# Patient Record
Sex: Female | Born: 1960 | Race: White | Hispanic: No | Marital: Married | Smoking: Former smoker
Health system: Southern US, Community
[De-identification: ages and names within clinical notes are randomized; demographics above are authoritative.]

## PROBLEM LIST (undated history)

## (undated) ENCOUNTER — Emergency Department (HOSPITAL_COMMUNITY): Admission: EM | Payer: Self-pay | Source: Home / Self Care

## (undated) DIAGNOSIS — I10 Essential (primary) hypertension: Secondary | ICD-10-CM

## (undated) DIAGNOSIS — F329 Major depressive disorder, single episode, unspecified: Secondary | ICD-10-CM

## (undated) DIAGNOSIS — G8929 Other chronic pain: Secondary | ICD-10-CM

## (undated) DIAGNOSIS — F32A Depression, unspecified: Secondary | ICD-10-CM

## (undated) DIAGNOSIS — M75100 Unspecified rotator cuff tear or rupture of unspecified shoulder, not specified as traumatic: Secondary | ICD-10-CM

## (undated) DIAGNOSIS — M549 Dorsalgia, unspecified: Secondary | ICD-10-CM

## (undated) DIAGNOSIS — Z9884 Bariatric surgery status: Secondary | ICD-10-CM

## (undated) DIAGNOSIS — C801 Malignant (primary) neoplasm, unspecified: Secondary | ICD-10-CM

## (undated) DIAGNOSIS — F419 Anxiety disorder, unspecified: Secondary | ICD-10-CM

## (undated) DIAGNOSIS — K760 Fatty (change of) liver, not elsewhere classified: Secondary | ICD-10-CM

## (undated) HISTORY — DX: Essential (primary) hypertension: I10

## (undated) HISTORY — DX: Unspecified rotator cuff tear or rupture of unspecified shoulder, not specified as traumatic: M75.100

## (undated) HISTORY — PX: CHOLECYSTECTOMY: SHX55

## (undated) HISTORY — DX: Major depressive disorder, single episode, unspecified: F32.9

## (undated) HISTORY — DX: Depression, unspecified: F32.A

## (undated) HISTORY — DX: Dorsalgia, unspecified: M54.9

## (undated) HISTORY — DX: Other chronic pain: G89.29

---
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
## 1996-05-17 HISTORY — PX: ABDOMINAL HYSTERECTOMY: SHX81

## 2009-07-28 ENCOUNTER — Observation Stay (HOSPITAL_COMMUNITY): Admission: EM | Admit: 2009-07-28 | Discharge: 2009-07-30 | Payer: Self-pay | Admitting: Emergency Medicine

## 2009-07-29 ENCOUNTER — Encounter (INDEPENDENT_AMBULATORY_CARE_PROVIDER_SITE_OTHER): Payer: Self-pay | Admitting: Internal Medicine

## 2010-08-10 LAB — COMPREHENSIVE METABOLIC PANEL
ALT: 69 U/L — ABNORMAL HIGH (ref 0–35)
AST: 29 U/L (ref 0–37)
Alkaline Phosphatase: 50 U/L (ref 39–117)
BUN: 18 mg/dL (ref 6–23)
CO2: 24 mEq/L (ref 19–32)
CO2: 27 mEq/L (ref 19–32)
Chloride: 107 mEq/L (ref 96–112)
Chloride: 108 mEq/L (ref 96–112)
Creatinine, Ser: 0.63 mg/dL (ref 0.4–1.2)
Creatinine, Ser: 0.69 mg/dL (ref 0.4–1.2)
GFR calc Af Amer: >60 mL/min (ref >60)
GFR calc non Af Amer: >60 mL/min (ref >60)
GFR calc non Af Amer: >60 mL/min (ref >60)
Potassium: 3.5 mEq/L (ref 3.5–5.1)
Sodium: 140 mEq/L (ref 135–145)
Total Bilirubin: 0.4 mg/dL (ref 0.3–1.2)
Total Bilirubin: 0.6 mg/dL (ref 0.3–1.2)

## 2010-08-10 LAB — CBC
HCT: 37 % (ref 36.0–46.0)
HCT: 38.2 % (ref 36.0–46.0)
Hemoglobin: 12.9 g/dL (ref 12.0–15.0)
Hemoglobin: 13.1 g/dL (ref 12.0–15.0)
MCHC: 34.3 g/dL (ref 30.0–36.0)
MCV: 89.9 fL (ref 78.0–100.0)
MCV: 90.9 fL (ref 78.0–100.0)
RBC: 4.12 MIL/uL (ref 3.87–5.11)
RBC: 4.2 MIL/uL (ref 3.87–5.11)
WBC: 7.1 10*3/uL (ref 4.0–10.5)

## 2010-08-10 LAB — DIFFERENTIAL
Basophils Absolute: 0 10*3/uL (ref 0.0–0.1)
Basophils Relative: 0 % (ref 0–1)
Eosinophils Relative: 1 % (ref 0–5)
Lymphocytes Relative: 28 % (ref 12–46)
Neutro Abs: 4.5 10*3/uL (ref 1.7–7.7)

## 2010-08-10 LAB — CK TOTAL AND CKMB (NOT AT ARMC)
CK, MB: 0.8 ng/mL (ref 0.3–4.0)
Relative Index: INVALID (ref 0.0–2.5)
Total CK: 71 U/L (ref 7–177)

## 2010-08-10 LAB — LIPID PANEL
HDL: 36 mg/dL — ABNORMAL LOW (ref >39)
Triglycerides: 101 mg/dL (ref <150)

## 2010-08-10 LAB — BASIC METABOLIC PANEL
CO2: 26 mEq/L (ref 19–32)
Calcium: 8.8 mg/dL (ref 8.4–10.5)
Chloride: 109 mEq/L (ref 96–112)
GFR calc Af Amer: >60 mL/min (ref >60)
Potassium: 4 mEq/L (ref 3.5–5.1)
Sodium: 139 mEq/L (ref 135–145)

## 2010-08-10 LAB — POCT CARDIAC MARKERS
CKMB, poc: <1 ng/mL — ABNORMAL LOW (ref 1.0–8.0)
Troponin i, poc: <0.05 ng/mL (ref 0.00–0.09)
Troponin i, poc: <0.05 ng/mL (ref 0.00–0.09)

## 2010-08-10 LAB — CARDIAC PANEL(CRET KIN+CKTOT+MB+TROPI): Troponin I: 0.03 ng/mL (ref 0.00–0.06)

## 2011-04-22 IMAGING — CR DG CHEST 2V
2 series · 2 of 2 positions shown · non-contrast
Comparison: None

CLINICAL DATA: Chest pain

CHEST - 2 VIEW

[w chest pa]
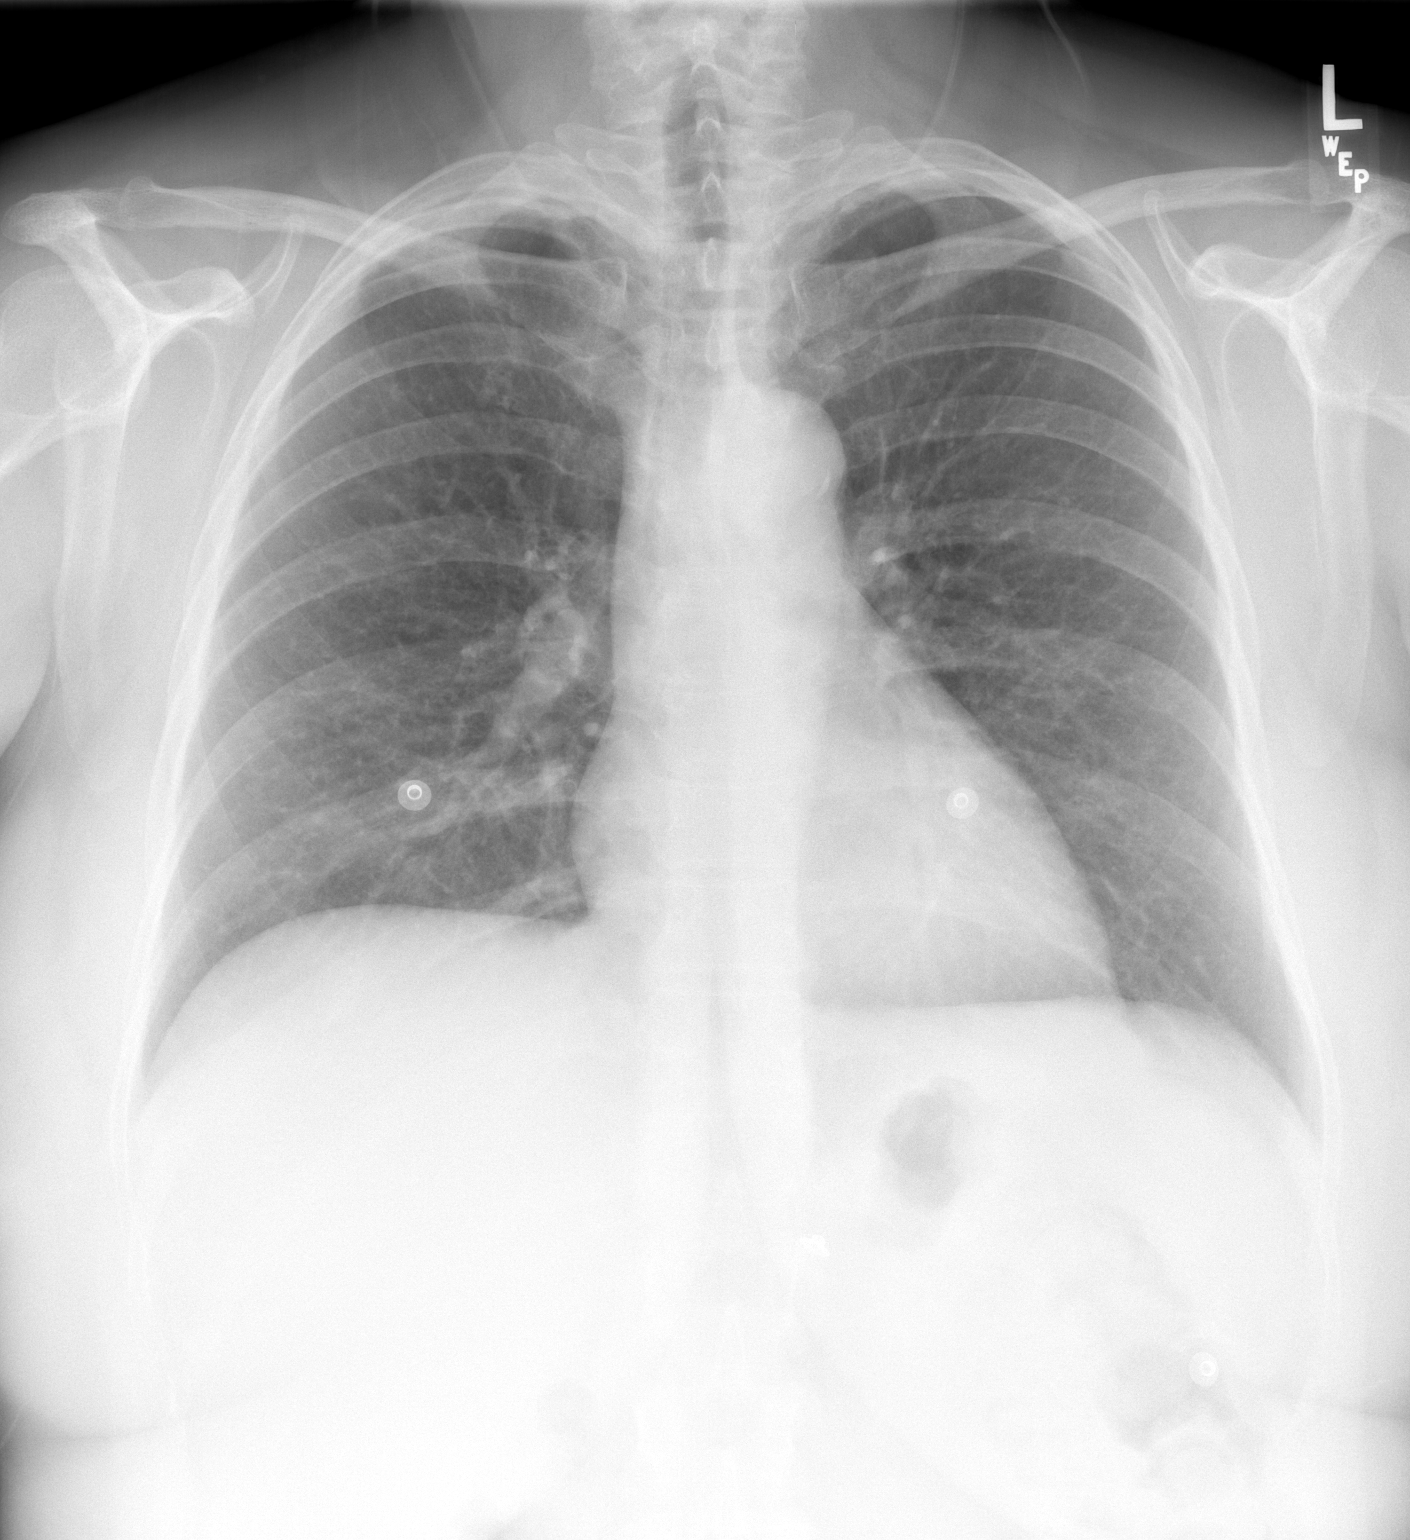

[w chest lat]
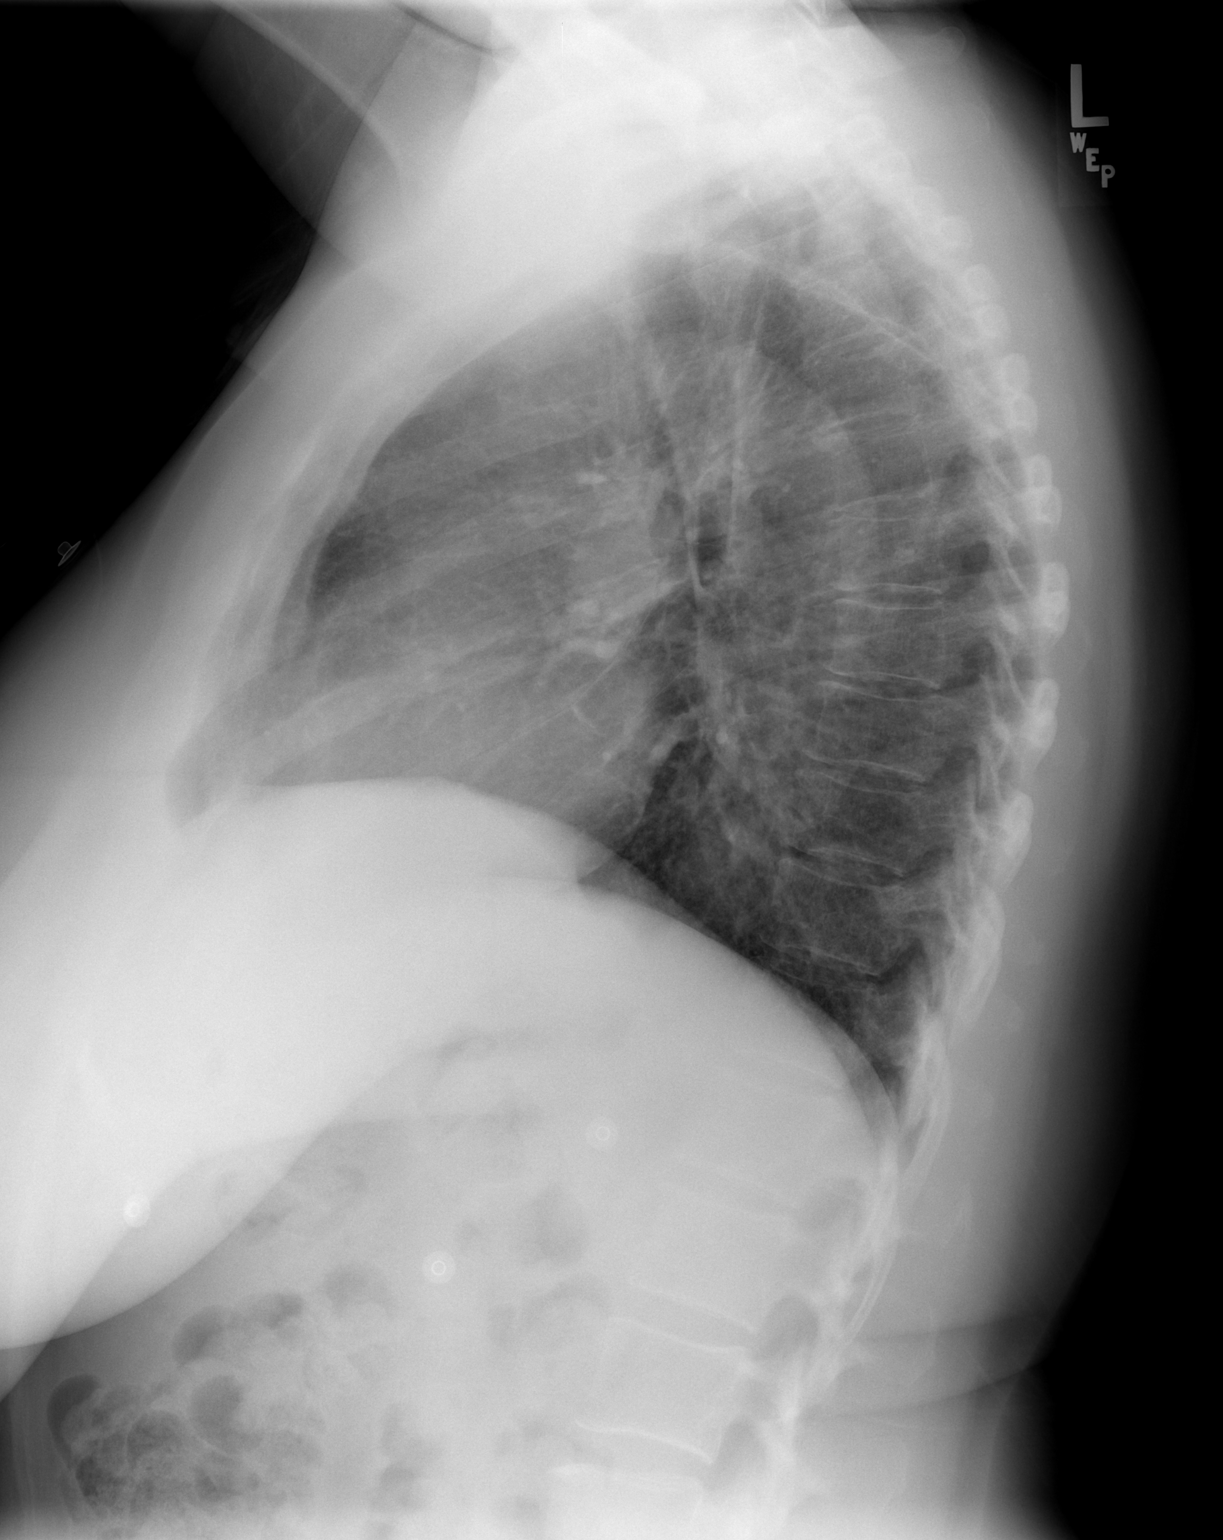

[2 of 2 positions shown; findings below may reference images not displayed]

FINDINGS: Heart and mediastinal contours normal.  Lungs clear.  No
pleural fluid.  Osseous structures and soft tissues unremarkable.
IMPRESSION: No acute or significant findings.

## 2013-09-12 ENCOUNTER — Ambulatory Visit (INDEPENDENT_AMBULATORY_CARE_PROVIDER_SITE_OTHER): Payer: BC Managed Care – PPO | Admitting: Surgery

## 2013-09-12 ENCOUNTER — Encounter (INDEPENDENT_AMBULATORY_CARE_PROVIDER_SITE_OTHER): Payer: Self-pay | Admitting: Surgery

## 2013-09-12 DIAGNOSIS — Z9989 Dependence on other enabling machines and devices: Secondary | ICD-10-CM

## 2013-09-12 DIAGNOSIS — G4733 Obstructive sleep apnea (adult) (pediatric): Secondary | ICD-10-CM | POA: Insufficient documentation

## 2013-09-12 DIAGNOSIS — I10 Essential (primary) hypertension: Secondary | ICD-10-CM

## 2013-09-12 DIAGNOSIS — K219 Gastro-esophageal reflux disease without esophagitis: Secondary | ICD-10-CM

## 2013-09-12 NOTE — Progress Notes (Signed)
Chief Complaint:  Morbid obesity; BMI 38 with multiple comorbidities  History of Present Illness:  Nancy Perry is an 53 y.o. female who lives in [place] and who works for [employer] has been to 3 of our seminars and presents to discuss roux en Y gastric bypass.  She is followed by Dr. Glendale Chard in [place].  She has borderline diabetes according to him, hypertension, GERD, and obstructive sleep apnea requiring CPAP. She has had difficulty with CPAP and keeping the mask on at night.  Her sister had bypass surgery leading to complete resolution of her diabetes.  I discussed the procedure with her in some detail including risk and benefits and gave her our comprehensive informed consent document.  Past Medical History  Diagnosis Date  . OSA on CPAP   . Depression   . GERD (gastroesophageal reflux disease)   . Rotator cuff tear     work related accident (Left shoulder)  . Back pain, chronic   . Hypertension     Past Surgical History  Procedure Laterality Date  . Tonsillectomy  1975  . Abdominal hysterectomy  1998    Current Outpatient Prescriptions  Medication Sig Dispense Refill  . cyclobenzaprine (FLEXERIL) 10 MG tablet       . DULoxetine (CYMBALTA) 60 MG capsule       . fluticasone (VERAMYST) 27.5 MCG/SPRAY nasal spray Place 2 sprays into the nose daily.      Marland Kitchen losartan-hydrochlorothiazide (HYZAAR) 100-12.5 MG per tablet       . oxyCODONE-acetaminophen (PERCOCET) 10-325 MG per tablet       . ranitidine (ZANTAC) 300 MG tablet        No current facility-administered medications for this visit.   Review of patient's allergies indicates no known allergies. No family history on file. Social History:   reports that she quit smoking about 17 months ago. She does not have any smokeless tobacco history on file. She reports that she does not drink alcohol. Her drug history is not on file.   REVIEW OF SYSTEMS - PERTINENT POSITIVES ONLY: Positive for loss of sleep  and fatigue, borderline diabetes, hypertension, hypertension or hiatal hernia with reflux, headaches, ear infection, sinus problems, and glass. Otherwise there is systems is negative. She has tried multiple diets including Phen-Fen twice, Weight Watchers, Slim fast, high protein [employer], Metabolife. She has been frustrated by all of these.  Physical Exam:   Blood pressure 122/78, pulse 71, temperature 98.3 F (36.8 C), temperature source Temporal, resp. rate 16, height 5\' 3"  (1.6 m), weight 216 lb 12.8 oz (98.34 kg). Body mass index is 38.41 kg/(m^2).  Gen:  WDWN white female NAD  Neurological: Alert and oriented to person, place, and time. Motor and sensory function is grossly intact  Head: Normocephalic and atraumatic.  Eyes: Conjunctivae are normal. Pupils are equal, round, and reactive to light. No scleral icterus.  Neck: Normal range of motion. Neck supple. No tracheal deviation or thyromegaly present.  Cardiovascular:  SR without murmurs or gallops.  No carotid bruits Respiratory: Effort normal.  No respiratory distress. No chest wall tenderness. Breath sounds normal.  No wheezes, rales or rhonchi.  Abdomen:  nontender obese GU: Musculoskeletal: Normal range of motion. Extremities are nontender. No cyanosis, edema or clubbing noted Lymphadenopathy: No cervical, preauricular, postauricular or axillary adenopathy is present Skin: Skin is warm and dry. No rash noted. No diaphoresis. No erythema. No pallor. Pscyh: Normal mood and affect. Behavior is normal. Judgment and thought content  normal.   LABORATORY RESULTS: No results found for this or any previous visit (from the past 48 hour(s)).  RADIOLOGY RESULTS: No results found.  Problem List: There are no active problems to display for this patient.   Assessment & Plan: Morbid obesity with multiple comorbidities and I think is a good candidate for gastric bypass. We'll begin a workup    Matt B. Hassell Done, MD, [facility] [phone] beeper [phone]  09/12/2013 3:04 PM

## 2013-09-12 NOTE — Patient Instructions (Signed)

## 2013-09-12 NOTE — Addendum Note (Signed)
Addended by: Ivor Costa on: 09/12/2013 03:23 PM   Modules accepted: Orders

## 2013-09-13 LAB — CBC WITH DIFFERENTIAL/PLATELET
BASOS PCT: 0 % (ref 0–1)
Basophils Absolute: 0 10*3/uL (ref 0.0–0.1)
EOS ABS: 0 10*3/uL (ref 0.0–0.7)
Eosinophils Relative: 0 % (ref 0–5)
HCT: 42 % (ref 36.0–46.0)
HEMOGLOBIN: 14.4 g/dL (ref 12.0–15.0)
LYMPHS ABS: 0.9 10*3/uL (ref 0.7–4.0)
Lymphocytes Relative: 13 % (ref 12–46)
MCH: 29.4 pg (ref 26.0–34.0)
MCHC: 34.3 g/dL (ref 30.0–36.0)
MCV: 85.7 fL (ref 78.0–100.0)
MONOS PCT: 1 % — AB (ref 3–12)
Monocytes Absolute: 0.1 10*3/uL (ref 0.1–1.0)
NEUTROS ABS: 6.2 10*3/uL (ref 1.7–7.7)
NEUTROS PCT: 86 % — AB (ref 43–77)
PLATELETS: 311 10*3/uL (ref 150–400)
RBC: 4.9 MIL/uL (ref 3.87–5.11)
RDW: 12.4 % (ref 11.5–15.5)
WBC: 7.2 10*3/uL (ref 4.0–10.5)

## 2013-09-13 LAB — HEMOGLOBIN A1C
Hgb A1c MFr Bld: 5.8 % — ABNORMAL HIGH (ref <5.7)
Mean Plasma Glucose: 120 mg/dL — ABNORMAL HIGH (ref <117)

## 2013-09-13 LAB — COMPREHENSIVE METABOLIC PANEL
ALBUMIN: 4.8 g/dL (ref 3.5–5.2)
ALK PHOS: 71 U/L (ref 39–117)
ALT: 61 U/L — AB (ref 0–35)
AST: 25 U/L (ref 0–37)
BILIRUBIN TOTAL: 0.5 mg/dL (ref 0.2–1.2)
BUN: 16 mg/dL (ref 6–23)
CO2: 24 mEq/L (ref 19–32)
Calcium: 10.4 mg/dL (ref 8.4–10.5)
Chloride: 102 mEq/L (ref 96–112)
Creat: 0.49 mg/dL — ABNORMAL LOW (ref 0.50–1.10)
GLUCOSE: 135 mg/dL — AB (ref 70–99)
POTASSIUM: 4 meq/L (ref 3.5–5.3)
SODIUM: 138 meq/L (ref 135–145)
TOTAL PROTEIN: 7.9 g/dL (ref 6.0–8.3)

## 2013-09-13 LAB — TSH: TSH: 0.116 u[IU]/mL — ABNORMAL LOW (ref 0.350–4.500)

## 2013-09-13 LAB — T4: T4, Total: 12.8 ug/dL — ABNORMAL HIGH (ref 5.0–12.5)

## 2013-09-13 LAB — LIPID PANEL
CHOL/HDL RATIO: 3.2 ratio
Cholesterol: 208 mg/dL — ABNORMAL HIGH (ref 0–200)
HDL: 65 mg/dL (ref >39)
LDL CALC: 131 mg/dL — AB (ref 0–99)
Triglycerides: 58 mg/dL (ref <150)
VLDL: 12 mg/dL (ref 0–40)

## 2013-09-13 LAB — H. PYLORI ANTIBODY, IGG: H PYLORI IGG: 0.83 {ISR}

## 2013-09-18 LAB — VITAMIN D 1,25 DIHYDROXY
VITAMIN D 1, 25 (OH) TOTAL: 52 pg/mL (ref 18–72)
Vitamin D2 1, 25 (OH)2: <8 pg/mL
Vitamin D3 1, 25 (OH)2: 52 pg/mL

## 2013-09-26 ENCOUNTER — Ambulatory Visit (HOSPITAL_COMMUNITY)
Admission: RE | Admit: 2013-09-26 | Discharge: 2013-09-26 | Disposition: A | Discharge status: Home / Self Care | Payer: BC Managed Care – PPO | Source: Ambulatory Visit | Attending: Surgery | Admitting: Surgery

## 2013-09-26 ENCOUNTER — Other Ambulatory Visit: Payer: Self-pay

## 2013-09-26 DIAGNOSIS — Z6838 Body mass index (BMI) 38.0-38.9, adult: Secondary | ICD-10-CM | POA: Insufficient documentation

## 2013-09-26 DIAGNOSIS — I10 Essential (primary) hypertension: Secondary | ICD-10-CM | POA: Insufficient documentation

## 2013-09-26 DIAGNOSIS — G4733 Obstructive sleep apnea (adult) (pediatric): Secondary | ICD-10-CM

## 2013-09-26 DIAGNOSIS — Z9989 Dependence on other enabling machines and devices: Secondary | ICD-10-CM

## 2013-09-26 DIAGNOSIS — E119 Type 2 diabetes mellitus without complications: Secondary | ICD-10-CM | POA: Insufficient documentation

## 2013-09-26 DIAGNOSIS — K219 Gastro-esophageal reflux disease without esophagitis: Secondary | ICD-10-CM

## 2013-09-26 DIAGNOSIS — Z1231 Encounter for screening mammogram for malignant neoplasm of breast: Secondary | ICD-10-CM | POA: Insufficient documentation

## 2013-09-26 DIAGNOSIS — K7689 Other specified diseases of liver: Secondary | ICD-10-CM | POA: Insufficient documentation

## 2013-09-26 DIAGNOSIS — F3289 Other specified depressive episodes: Secondary | ICD-10-CM | POA: Insufficient documentation

## 2013-09-26 DIAGNOSIS — K8020 Calculus of gallbladder without cholecystitis without obstruction: Secondary | ICD-10-CM | POA: Insufficient documentation

## 2013-09-26 DIAGNOSIS — F329 Major depressive disorder, single episode, unspecified: Secondary | ICD-10-CM | POA: Insufficient documentation

## 2013-10-16 ENCOUNTER — Encounter: Payer: Self-pay | Admitting: Dietician

## 2013-10-16 ENCOUNTER — Encounter: Payer: BC Managed Care – PPO | Attending: Surgery | Admitting: Dietician

## 2013-10-16 DIAGNOSIS — Z713 Dietary counseling and surveillance: Secondary | ICD-10-CM | POA: Insufficient documentation

## 2013-10-16 DIAGNOSIS — Z6839 Body mass index (BMI) 39.0-39.9, adult: Secondary | ICD-10-CM | POA: Insufficient documentation

## 2013-10-16 NOTE — Patient Instructions (Signed)
Start working on [employer] Try protein shakes Call [facility] once surgery scheduled to enroll in Pre Op class

## 2013-10-16 NOTE — Progress Notes (Signed)
  Pre-Op Assessment Visit:  Pre-Operative RYGB Surgery  Medical Nutrition Therapy:  Appt start time: 3532   End time:  1150.  Patient was seen on 10/16/2013 for Pre-Operative RYGB Nutrition Assessment. Assessment and letter of approval faxed to [facility] Bariatric Surgery Program coordinator on 10/16/2013.   Preferred Learning Style:   No preference indicated   Learning Readiness:   Ready  Handouts given during visit include:  Pre-Op Goals Bariatric Surgery Protein Shakes  Teaching Method Utilized:  Visual Auditory Hands on  Barriers to learning/adherence to lifestyle change: none  Demonstrated degree of understanding via:  Teach Back   [facility] to call the [facility] to enroll in Pre-Op and Post-Op Nutrition Education when surgery date is scheduled.

## 2013-10-17 ENCOUNTER — Telehealth (INDEPENDENT_AMBULATORY_CARE_PROVIDER_SITE_OTHER): Payer: Self-pay

## 2013-10-17 NOTE — Telephone Encounter (Signed)
Office notes faxed to Dr. Olevia Bowens @ [place].  Fax confirmation rec'd.

## 2013-11-14 ENCOUNTER — Emergency Department: Payer: Self-pay | Admitting: Emergency Medicine

## 2013-11-14 LAB — COMPREHENSIVE METABOLIC PANEL
ALBUMIN: 3.9 g/dL (ref 3.4–5.0)
ANION GAP: 9 (ref 7–16)
AST: 44 U/L — AB (ref 15–37)
Alkaline Phosphatase: 69 U/L
BILIRUBIN TOTAL: 0.3 mg/dL (ref 0.2–1.0)
BUN: 14 mg/dL (ref 7–18)
CALCIUM: 9.1 mg/dL (ref 8.5–10.1)
CHLORIDE: 109 mmol/L — AB (ref 98–107)
Co2: 24 mmol/L (ref 21–32)
Creatinine: 0.78 mg/dL (ref 0.60–1.30)
EGFR (African American): >60
GLUCOSE: 148 mg/dL — AB (ref 65–99)
OSMOLALITY: 286 (ref 275–301)
Potassium: 3.9 mmol/L (ref 3.5–5.1)
SGPT (ALT): 103 U/L — ABNORMAL HIGH (ref 12–78)
SODIUM: 142 mmol/L (ref 136–145)
Total Protein: 8 g/dL (ref 6.4–8.2)

## 2013-11-14 LAB — TROPONIN I: Troponin-I: <0.02 ng/mL

## 2013-11-14 LAB — CBC WITH DIFFERENTIAL/PLATELET
Basophil #: 0.1 10*3/uL (ref 0.0–0.1)
Basophil %: 0.9 %
EOS PCT: 1.2 %
Eosinophil #: 0.1 10*3/uL (ref 0.0–0.7)
HCT: 40.7 % (ref 35.0–47.0)
HGB: 13.8 g/dL (ref 12.0–16.0)
Lymphocyte #: 1.3 10*3/uL (ref 1.0–3.6)
Lymphocyte %: 14 %
MCH: 30.1 pg (ref 26.0–34.0)
MCHC: 34 g/dL (ref 32.0–36.0)
MCV: 88 fL (ref 80–100)
Monocyte #: 0.5 x10 3/mm (ref 0.2–0.9)
Monocyte %: 5.4 %
NEUTROS PCT: 78.5 %
Neutrophil #: 7.4 10*3/uL — ABNORMAL HIGH (ref 1.4–6.5)
PLATELETS: 275 10*3/uL (ref 150–440)
RBC: 4.6 10*6/uL (ref 3.80–5.20)
RDW: 12.4 % (ref 11.5–14.5)
WBC: 9.4 10*3/uL (ref 3.6–11.0)

## 2013-11-14 LAB — LIPASE, BLOOD: Lipase: 90 U/L (ref 73–393)

## 2013-12-14 ENCOUNTER — Other Ambulatory Visit (INDEPENDENT_AMBULATORY_CARE_PROVIDER_SITE_OTHER): Payer: Self-pay | Admitting: Surgery

## 2013-12-24 ENCOUNTER — Encounter: Payer: BC Managed Care – PPO | Attending: Surgery

## 2013-12-24 DIAGNOSIS — Z713 Dietary counseling and surveillance: Secondary | ICD-10-CM | POA: Diagnosis not present

## 2013-12-24 DIAGNOSIS — Z6839 Body mass index (BMI) 39.0-39.9, adult: Secondary | ICD-10-CM | POA: Diagnosis not present

## 2013-12-27 NOTE — Progress Notes (Signed)
  Pre-Operative Nutrition Class:  Appt start time: 7915   End time:  1830.  Patient was seen on 12/24/13 for Pre-Operative Bariatric Surgery Education at [facility].   Surgery date: 01/15/14 Surgery type: RYGB Start weight at [facility]: 220.5 lbs on 10/16/13 Weight today: 224 lbs  TANITA  BODY COMP RESULTS  12/24/13   BMI (kg/m^2) 39.7   Fat Mass (lbs) 112   Fat Free Mass (lbs) 112   Total Body Water (lbs) 82    Samples given per MNT protocol. Patient educated on appropriate usage:   Unjury protein powder (unflavored - qty 1)  Lot #: [id number]  Exp: 02/2015   Bariactiv Calcium Citrate (qty 1)  Lot #: [id number]  Exp: 10/2014   Celebrate Vitamins Multivitamin (qty 1)  Lot #: [id number]  Exp: 08/2014   Premier protein shake (chocolate - qty 1)  Lot #: [id number]  Exp: 08/2014   The following the learning objectives were met by the patient during this course:  Identify Pre-Op Dietary Goals and will begin 2 weeks pre-operatively  Identify appropriate sources of fluids and proteins   State protein recommendations and appropriate sources pre and post-operatively  Identify Post-Operative Dietary Goals and will follow for 2 weeks post-operatively  Identify appropriate multivitamin and calcium sources  Describe the need for physical activity post-operatively and will follow MD recommendations  State when to call healthcare provider regarding medication questions or post-operative complications  Handouts given during class include:  Pre-Op Bariatric Surgery Diet Handout  Protein Shake Handout  Post-Op Bariatric Surgery Nutrition Handout  BELT Program Information Flyer  Support Group Information Flyer  [facility] Bariatric Supplements Price List  Follow-Up Plan: Patient will follow-up at [facility] 2 weeks post operatively for diet advancement per MD.

## 2014-01-03 ENCOUNTER — Telehealth (INDEPENDENT_AMBULATORY_CARE_PROVIDER_SITE_OTHER): Payer: Self-pay

## 2014-01-03 NOTE — Telephone Encounter (Signed)
Pt mother called in asking if Dr Hassell Done has answered their question about pts shoulder surgery. They are concerned about have anesthesia 2 times so close together. Explained they have an appointment tomorrow and that they could discuss this then. She explained they have a 24 hours time line to cancel surgery and with the late app they have it would not be enough time. She states she will go ahead and cancel the shoulder surgery for next week and keep the gastric bypass surgery as scheduled. She does not wish to wait until tomorrow.

## 2014-01-04 ENCOUNTER — Ambulatory Visit (INDEPENDENT_AMBULATORY_CARE_PROVIDER_SITE_OTHER): Payer: BC Managed Care – PPO | Admitting: Surgery

## 2014-01-04 VITALS — BP 140/90 | HR 80 | Temp 98.2°F | Ht 63.0 in | Wt 222.4 lb

## 2014-01-04 DIAGNOSIS — K8020 Calculus of gallbladder without cholecystitis without obstruction: Secondary | ICD-10-CM | POA: Insufficient documentation

## 2014-01-04 MED ORDER — HYDROCODONE-ACETAMINOPHEN 7.5-325 MG/15ML PO SOLN: 15.0000 mL | ORAL | Status: DC | PRN

## 2014-01-04 NOTE — Progress Notes (Signed)
Chief Complaint: Morbid obesity; BMI 38 with multiple comorbidities  History of Present Illness: Nancy Perry is an 53 y.o. female who lives in [place] and who works for [employer] has been to 3 of our seminars and presents to discuss roux en Y gastric bypass. She is followed by Dr. Glendale Chard in [place]. She has borderline diabetes according to him, hypertension, GERD, and obstructive sleep apnea requiring CPAP. She has had difficulty with CPAP and keeping the mask on at night. Her sister had bypass surgery leading to complete resolution of her diabetes.  I discussed the procedure with her in some detail including risk and benefits and gave her our comprehensive informed consent document.  Past Medical History   Diagnosis  Date   .  OSA on CPAP    .  Depression    .  GERD (gastroesophageal reflux disease)    .  Rotator cuff tear      work related accident (Left shoulder)   .  Back pain, chronic    .  Hypertension     Past Surgical History   Procedure  Laterality  Date   .  Tonsillectomy   1975   .  Abdominal hysterectomy   1998    Current Outpatient Prescriptions   Medication  Sig  Dispense  Refill   .  cyclobenzaprine (FLEXERIL) 10 MG tablet      .  DULoxetine (CYMBALTA) 60 MG capsule      .  fluticasone (VERAMYST) 27.5 MCG/SPRAY nasal spray  Place 2 sprays into the nose daily.     Marland Kitchen  losartan-hydrochlorothiazide (HYZAAR) 100-12.5 MG per tablet      .  oxyCODONE-acetaminophen (PERCOCET) 10-325 MG per tablet      .  ranitidine (ZANTAC) 300 MG tablet       No current facility-administered medications for this visit.   Review of patient's allergies indicates no known allergies.  No family history on file.  Social History: reports that she quit smoking about 17 months ago. She does not have any smokeless tobacco history on file. She reports that she does not drink alcohol. Her drug history is not on file.  REVIEW OF SYSTEMS - PERTINENT POSITIVES ONLY:  Positive  for loss of sleep and fatigue, borderline diabetes, hypertension, hypertension or hiatal hernia with reflux, headaches, ear infection, sinus problems, and glass. Otherwise there is systems is negative. She has tried multiple diets including Phen-Fen twice, Weight Watchers, Slim fast, high protein [employer], Metabolife. She has been frustrated by all of these.  Physical Exam:  Blood pressure 122/78, pulse 71, temperature 98.3 F (36.8 C), temperature source Temporal, resp. rate 16, height 5\' 3"  (1.6 m), weight 216 lb 12.8 oz (98.34 kg).  Body mass index is 38.41 kg/(m^2).  Gen: WDWN white female NAD  Neurological: Alert and oriented to person, place, and time. Motor and sensory function is grossly intact  Head: Normocephalic and atraumatic.  Eyes: Conjunctivae are normal. Pupils are equal, round, and reactive to light. No scleral icterus.  Neck: Normal range of motion. Neck supple. No tracheal deviation or thyromegaly present.  Cardiovascular: SR without murmurs or gallops. No carotid bruits  Respiratory: Effort normal. No respiratory distress. No chest wall tenderness. Breath sounds normal. No wheezes, rales or rhonchi.  Abdomen: nontender obese  GU:  Musculoskeletal: Normal range of motion. Extremities are nontender. No cyanosis, edema or clubbing noted Lymphadenopathy: No cervical, preauricular, postauricular or axillary adenopathy is present Skin: Skin is warm and  dry. No rash noted. No diaphoresis. No erythema. No pallor. Pscyh: Normal mood and affect. Behavior is normal. Judgment and thought content normal.  LABORATORY RESULTS:  No results found for this or any previous visit (from the past 48 hour(s)).  RADIOLOGY RESULTS:  Gallbladder:  Within the gallbladder, there are 2 echogenic foci which move and  shadow consistent with gallstones. One of these foci measures 1.8 x  1.8 x 2.1 cm. A second focus measures 1.6 x 1.5 x 1.4 cm.  Gallbladder wall is not thickened, and there is no  pericholecystic  fluid. No sonographic Murphy sign noted.  No results found.  Patient Active Problem List   Diagnosis Date Noted  . Morbid obesity 09/12/2013  . Essential hypertension, benign 09/12/2013  . OSA on CPAP 09/12/2013  . GERD (gastroesophageal reflux disease) 09/12/2013   Assessment & Plan:  Morbid obesity with multiple comorbidities and I think is a good candidate for gastric bypass. She had a lap chole at [facility] last month.   No evidence of a hiatal hernia. We plan laparoscopic Roux-en-Y on September 1. Matt B. Hassell Done, MD, [facility]  [phone] beeper  [phone]

## 2014-01-04 NOTE — Patient Instructions (Signed)
Two weeks prior to surgery  Go on the extremely low carb liquid diet One week prior to surgery  No aspirin products.  Tylenol is acceptable  Stop smoking 24 hours prior to surgery  No alcoholic beverages  Report fever greater than 100.5 or excessive nasal drainage suggesting infection  Continue bariatric preop diet  Perform bowel prep if ordered  Do not eat or drink anything after midnight the night before surgery  Do not take any medications except those instructed by the anesthesiologist Morning of surgery  Please arrive at the hospital at least 2 hours before your scheduled surgery time.  No makeup, fingernail polish or jewelry  Bring insurance cards with you  Bring your CPAP mask if you use this  Gastric Bypass Surgery, Care After Refer to this sheet in the next few weeks. These discharge instructions provide you with general information on caring for yourself after you leave the hospital. Your caregiver may also give you specific instructions. Your treatment has been planned according to the most current medical practices available, but unavoidable complications sometimes occur. If you have any problems or questions after discharge, call your caregiver. HOME CARE INSTRUCTIONS  Activity  Take frequent walks throughout the day. This will help to prevent blood clots. Do not sit for longer than 45 minutes to 1 hour while awake for 4 to 6 weeks after surgery.  Continue to do coughing and deep breathing exercises once you get home. This will help to prevent pneumonia.  Do not do strenuous activities, such as heavy lifting, pushing, or pulling, until after your follow-up visit with your caregiver. Do not lift anything heavier than 10 lb (4.5 kg).  Talk with your caregiver about when you may return to work and your exercise routine.  Do not drive while taking prescription pain medicine. Nutrition  It is very important that you drink at least 80 oz (2,400 mL) of fluid a day.  You  should stay on a liquid diet until your follow-up visit with your caregiver. Keep sugar-free, liquid items on hand, including:  Tea: hot or cold. Drink only decaffeinated for the first month.  Broths: beef, chicken, vegetable.  Others: water, sugar-free frozen ice pops, flavored water, gelatin (after 1 week).  Do not consume caffeine for 1 month. Large amounts of caffeine can cause dehydration.  A dietician may also give you specific instructions.  Follow your caregiver's recommendations about vitamins and protein requirements after surgery. Hygiene  You may shower and wash your hair 2 days after surgery. Pat incisions dry. Do not rub incisions with a washcloth or towel.  Follow your caregiver's recommendations about baths and pools following surgery. Pain control  If a prescription medicine was given, follow your caregiver's directions.  You may feel some gas pain caused by the carbon dioxide used to inflate your abdomen during surgery. This pain can be felt in your chest, shoulder, back, or abdominal area. Moving around often is advised. Incision care  You may have 4 or more small incisions. They are closed with skin adhesive strips. Skin adhesive strips can get wet and will fall off on their own. Check your incisions and surrounding area daily for any redness, swelling, discoloration, fluid (drainage), or bleeding. Dark red, dried blood may appear under these coverings. This is normal.  If you have a drain, it will be removed at your follow-up visit or before you leave the hospital.  If your drain is left in, follow your caregiver's instructions on drain care.  If  your drain is taken out, keep a clean, dry bandage over the drain site. SEEK MEDICAL CARE IF:   You develop persistent nausea and vomiting.  You have pain and discomfort with swallowing.  You have pain, swelling, or warmth in the lower extremities.  You have an oral temperature above 102 F (38.9 C).  You  develop chills.  Your incision sites look red, swollen, or have drainage.  Your stool is black, tarry, or maroon in color.  You are lightheaded when standing.  You notice a bruise getting larger.  You have any questions or concerns. SEEK IMMEDIATE MEDICAL CARE IF:   You have chest pain.  You have severe calf pain or pain not relieved by medicine.  You develop shortness of breath or difficulty breathing.  There is bright red blood coming from the drain.  You feel confused.  You have slurred speech.  You suddenly feel weak. MAKE SURE YOU:   Understand these instructions.  Will watch your condition.  Will get help right away if you are not doing well or get worse. Document Released: 12/16/2003 Document Revised: 09/17/2013 Document Reviewed: 09/23/2009 [facility] Patient Information 2015 [place], [place]. This information is not intended to replace advice given to you by your health care provider. Make sure you discuss any questions you have with your health care provider.

## 2014-01-11 ENCOUNTER — Encounter (HOSPITAL_COMMUNITY)
Admission: RE | Admit: 2014-01-11 | Discharge: 2014-01-11 | Disposition: A | Discharge status: Home / Self Care | Payer: BC Managed Care – PPO | Source: Ambulatory Visit | Attending: Surgery | Admitting: Surgery

## 2014-01-11 ENCOUNTER — Encounter (HOSPITAL_COMMUNITY): Payer: Self-pay

## 2014-01-11 ENCOUNTER — Encounter (HOSPITAL_COMMUNITY): Payer: Self-pay | Admitting: Pharmacy Technician

## 2014-01-11 DIAGNOSIS — Z6839 Body mass index (BMI) 39.0-39.9, adult: Secondary | ICD-10-CM | POA: Diagnosis not present

## 2014-01-11 DIAGNOSIS — Z01818 Encounter for other preprocedural examination: Secondary | ICD-10-CM | POA: Diagnosis present

## 2014-01-11 HISTORY — DX: Fatty (change of) liver, not elsewhere classified: K76.0

## 2014-01-11 LAB — COMPREHENSIVE METABOLIC PANEL
ALT: 70 U/L — AB (ref 0–35)
AST: 30 U/L (ref 0–37)
Albumin: 4.2 g/dL (ref 3.5–5.2)
Alkaline Phosphatase: 60 U/L (ref 39–117)
Anion gap: 14 (ref 5–15)
BUN: 20 mg/dL (ref 6–23)
CALCIUM: 10.2 mg/dL (ref 8.4–10.5)
CO2: 25 mEq/L (ref 19–32)
Chloride: 101 mEq/L (ref 96–112)
Creatinine, Ser: 0.54 mg/dL (ref 0.50–1.10)
GFR calc Af Amer: >90 mL/min (ref >90)
GFR calc non Af Amer: >90 mL/min (ref >90)
Glucose, Bld: 104 mg/dL — ABNORMAL HIGH (ref 70–99)
POTASSIUM: 3.9 meq/L (ref 3.7–5.3)
Sodium: 140 mEq/L (ref 137–147)
TOTAL PROTEIN: 7.8 g/dL (ref 6.0–8.3)
Total Bilirubin: 0.3 mg/dL (ref 0.3–1.2)

## 2014-01-11 LAB — CBC WITH DIFFERENTIAL/PLATELET
BASOS ABS: 0 10*3/uL (ref 0.0–0.1)
Basophils Relative: 0 % (ref 0–1)
EOS ABS: 0.1 10*3/uL (ref 0.0–0.7)
EOS PCT: 1 % (ref 0–5)
HCT: 39.5 % (ref 36.0–46.0)
Hemoglobin: 13.2 g/dL (ref 12.0–15.0)
LYMPHS PCT: 30 % (ref 12–46)
Lymphs Abs: 1.8 10*3/uL (ref 0.7–4.0)
MCH: 30 pg (ref 26.0–34.0)
MCHC: 33.4 g/dL (ref 30.0–36.0)
MCV: 89.8 fL (ref 78.0–100.0)
Monocytes Absolute: 0.3 10*3/uL (ref 0.1–1.0)
Monocytes Relative: 5 % (ref 3–12)
Neutro Abs: 3.8 10*3/uL (ref 1.7–7.7)
Neutrophils Relative %: 64 % (ref 43–77)
PLATELETS: 264 10*3/uL (ref 150–400)
RBC: 4.4 MIL/uL (ref 3.87–5.11)
RDW: 12 % (ref 11.5–15.5)
WBC: 5.9 10*3/uL (ref 4.0–10.5)

## 2014-01-11 NOTE — Pre-Procedure Instructions (Deleted)
22 Nancy Perry  01/11/2014   Your procedure is scheduled on: 9-1  -2015  Enter through [facility] Entrance and follow signs to [facility]. Arrive at 0900       AM..  Call this number if you have problems the morning of surgery: [phone]  Or Presurgical Testing [phone].   For Living Will and/or Health Care Power Attorney Forms: please provide copy for your medical record,may bring AM of surgery(Forms should be already notarized -we do not provide this service).(01-11-14  No information preferred today).  Remember: Follow any bowel prep instructions per MD office. For Cpap use: Bring mask and tubing only.   Do not eat food/ or drink: After Midnight.      Take these medicines the morning of surgery with A SIP OF WATER: Cymbalta. Pain med(if needed)   Do not wear jewelry, make-up or nail polish.  Do not wear lotions, powders, or perfumes. You may wear deodorant.  Do not shave 48 hours(2 days) prior to first CHG shower(legs and under arms).(Shaving face and neck okay.)  Do not bring valuables to the hospital.(Hospital is not responsible for lost valuables).  Contacts, dentures or removable bridgework, body piercing, hair pins may not be worn into surgery.  Leave suitcase in the car. After surgery it may be brought to your room.  For patients admitted to the hospital, checkout time is 11:00 AM the day of discharge.(Restricted visitors-Any Persons displaying flu-like symptoms or illness).    Patients discharged the day of surgery will not be allowed to drive home. Must have responsible person with you x 24 hours once discharged.  Name and phone number of your driver: Nancy Perry [phone] cell  Special Instructions: CHG(Chlorhedine 4%-"Hibiclens","Betasept","Aplicare") Shower Use Special Wash: see special instructions.(avoid face and genitals)     ___________________________    [facility] - Preparing for Surgery Before surgery, you can play an important  role.  Because skin is not sterile, your skin needs to be as free of germs as possible.  You can reduce the number of germs on your skin by washing with CHG (chlorahexidine gluconate) soap before surgery.  CHG is an antiseptic cleaner which kills germs and bonds with the skin to continue killing germs even after washing. Please DO NOT use if you have an allergy to CHG or antibacterial soaps.  If your skin becomes reddened/irritated stop using the CHG and inform your nurse when you arrive at Short Stay. Do not shave (including legs and underarms) for at least 48 hours prior to the first CHG shower.  You may shave your face/neck. Please follow these instructions carefully:  1.  Shower with CHG Soap the night before surgery and the  morning of Surgery.  2.  If you choose to wash your hair, wash your hair first as usual with your  normal  shampoo.  3.  After you shampoo, rinse your hair and body thoroughly to remove the  shampoo.                           4.  Use CHG as you would any other liquid soap.  You can apply chg directly  to the skin and wash                       Gently with a scrungie or clean washcloth.  5.  Apply the CHG Soap to your body ONLY FROM THE NECK DOWN.  Do not use on face/ open                           Wound or open sores. Avoid contact with eyes, ears mouth and genitals (private parts).                       Wash face,  Genitals (private parts) with your normal soap.             6.  Wash thoroughly, paying special attention to the area where your surgery  will be performed.  7.  Thoroughly rinse your body with warm water from the neck down.  8.  DO NOT shower/wash with your normal soap after using and rinsing off  the CHG Soap.                9.  Pat yourself dry with a clean towel.            10.  Wear clean pajamas.            11.  Place clean sheets on your bed the night of your first shower and do not  sleep with pets. Day of Surgery : Do not apply any lotions/deodorants  the morning of surgery.  Please wear clean clothes to the hospital/surgery center.  FAILURE TO FOLLOW THESE INSTRUCTIONS MAY RESULT IN THE CANCELLATION OF YOUR SURGERY PATIENT SIGNATURE_________________________________  NURSE SIGNATURE__________________________________  ________________________________________________________________________

## 2014-01-11 NOTE — Pre-Procedure Instructions (Signed)
01-11-14 EKG 4'15, CXR 5'15 Epic.

## 2014-01-11 NOTE — Patient Instructions (Addendum)
20 Nancy Perry  01/11/2014   Your procedure is scheduled on:  9-1 -2015 Tuesday  Enter through [facility] Entrance and follow signs to [facility]. Arrive at     0900    AM.  Call this number if you have problems the morning of surgery: [phone]  Or Presurgical Testing [phone].   For Living Will and/or Health Care Power Attorney Forms: please provide copy for your medical record,may bring AM of surgery(Forms should be already notarized -we do not provide this service).(01-11-14  No information preferred today).  Remember: Follow any bowel prep instructions per MD office. For Cpap use: Bring mask and tubing only.   Do not eat food/ or drink: After Midnight.      Take these medicines the morning of surgery with A SIP OF WATER: Cymbalta. Pain med(if needed). Use/bring Veramyst.   Do not wear jewelry, make-up or nail polish.  Do not wear lotions, powders, or perfumes. You may wear deodorant.  Do not shave 48 hours(2 days) prior to first CHG shower(legs and under arms).(Shaving face and neck okay.)  Do not bring valuables to the hospital.(Hospital is not responsible for lost valuables).  Contacts, dentures or removable bridgework, body piercing, hair pins may not be worn into surgery.  Leave suitcase in the car. After surgery it may be brought to your room.  For patients admitted to the hospital, checkout time is 11:00 AM the day of discharge.(Restricted visitors-Any Persons displaying flu-like symptoms or illness).    Patients discharged the day of surgery will not be allowed to drive home. Must have responsible person with you x 24 hours once discharged.  Name and phone number of your driver: Nancy Perry [phone] cell  Special Instructions: CHG(Chlorhedine 4%-"Hibiclens","Betasept","Aplicare") Shower Use Special Wash: see special instructions.(avoid face and genitals).        ___________________________    [facility] - Preparing for Surgery Before  surgery, you can play an important role.  Because skin is not sterile, your skin needs to be as free of germs as possible.  You can reduce the number of germs on your skin by washing with CHG (chlorahexidine gluconate) soap before surgery.  CHG is an antiseptic cleaner which kills germs and bonds with the skin to continue killing germs even after washing. Please DO NOT use if you have an allergy to CHG or antibacterial soaps.  If your skin becomes reddened/irritated stop using the CHG and inform your nurse when you arrive at Short Stay. Do not shave (including legs and underarms) for at least 48 hours prior to the first CHG shower.  You may shave your face/neck. Please follow these instructions carefully:  1.  Shower with CHG Soap the night before surgery and the  morning of Surgery.  2.  If you choose to wash your hair, wash your hair first as usual with your  normal  shampoo.  3.  After you shampoo, rinse your hair and body thoroughly to remove the  shampoo.                           4.  Use CHG as you would any other liquid soap.  You can apply chg directly  to the skin and wash                       Gently with a scrungie or clean washcloth.  5.  Apply the CHG Soap to  your body ONLY FROM THE NECK DOWN.   Do not use on face/ open                           Wound or open sores. Avoid contact with eyes, ears mouth and genitals (private parts).                       Wash face,  Genitals (private parts) with your normal soap.             6.  Wash thoroughly, paying special attention to the area where your surgery  will be performed.  7.  Thoroughly rinse your body with warm water from the neck down.  8.  DO NOT shower/wash with your normal soap after using and rinsing off  the CHG Soap.                9.  Pat yourself dry with a clean towel.            10.  Wear clean pajamas.            11.  Place clean sheets on your bed the night of your first shower and do not  sleep with pets. Day of Surgery  : Do not apply any lotions/deodorants the morning of surgery.  Please wear clean clothes to the hospital/surgery center.  FAILURE TO FOLLOW THESE INSTRUCTIONS MAY RESULT IN THE CANCELLATION OF YOUR SURGERY PATIENT SIGNATURE_________________________________  NURSE SIGNATURE__________________________________  ________________________________________________________________________

## 2014-01-15 ENCOUNTER — Encounter (HOSPITAL_COMMUNITY)
Admission: RE | Disposition: A | Discharge status: Home / Self Care | Payer: Self-pay | Source: Ambulatory Visit | Attending: Surgery

## 2014-01-15 ENCOUNTER — Encounter (HOSPITAL_COMMUNITY): Payer: Self-pay | Admitting: *Deleted

## 2014-01-15 ENCOUNTER — Ambulatory Visit (HOSPITAL_COMMUNITY): Payer: BC Managed Care – PPO | Admitting: Anesthesiology

## 2014-01-15 ENCOUNTER — Encounter (HOSPITAL_COMMUNITY): Payer: BC Managed Care – PPO | Admitting: Anesthesiology

## 2014-01-15 ENCOUNTER — Inpatient Hospital Stay (HOSPITAL_COMMUNITY)
Admission: RE | Admit: 2014-01-15 | Discharge: 2014-01-17 | DRG: 621 | Disposition: A | Discharge status: Home / Self Care | Payer: BC Managed Care – PPO | Source: Ambulatory Visit | Attending: Surgery | Admitting: Surgery

## 2014-01-15 DIAGNOSIS — G4733 Obstructive sleep apnea (adult) (pediatric): Secondary | ICD-10-CM | POA: Diagnosis present

## 2014-01-15 DIAGNOSIS — Z6838 Body mass index (BMI) 38.0-38.9, adult: Secondary | ICD-10-CM

## 2014-01-15 DIAGNOSIS — Z9089 Acquired absence of other organs: Secondary | ICD-10-CM | POA: Diagnosis not present

## 2014-01-15 DIAGNOSIS — F329 Major depressive disorder, single episode, unspecified: Secondary | ICD-10-CM | POA: Diagnosis present

## 2014-01-15 DIAGNOSIS — I10 Essential (primary) hypertension: Secondary | ICD-10-CM | POA: Diagnosis present

## 2014-01-15 DIAGNOSIS — Z833 Family history of diabetes mellitus: Secondary | ICD-10-CM | POA: Diagnosis not present

## 2014-01-15 DIAGNOSIS — Z01812 Encounter for preprocedural laboratory examination: Secondary | ICD-10-CM | POA: Diagnosis not present

## 2014-01-15 DIAGNOSIS — K219 Gastro-esophageal reflux disease without esophagitis: Secondary | ICD-10-CM | POA: Diagnosis present

## 2014-01-15 DIAGNOSIS — Z6839 Body mass index (BMI) 39.0-39.9, adult: Secondary | ICD-10-CM

## 2014-01-15 DIAGNOSIS — F3289 Other specified depressive episodes: Secondary | ICD-10-CM | POA: Diagnosis present

## 2014-01-15 DIAGNOSIS — Z79899 Other long term (current) drug therapy: Secondary | ICD-10-CM | POA: Diagnosis not present

## 2014-01-15 DIAGNOSIS — Z9884 Bariatric surgery status: Secondary | ICD-10-CM

## 2014-01-15 DIAGNOSIS — Z87891 Personal history of nicotine dependence: Secondary | ICD-10-CM | POA: Diagnosis not present

## 2014-01-15 HISTORY — PX: GASTRIC ROUX-EN-Y: SHX5262

## 2014-01-15 LAB — CREATININE, SERUM
Creatinine, Ser: 0.58 mg/dL (ref 0.50–1.10)
GFR calc non Af Amer: >90 mL/min (ref >90)

## 2014-01-15 LAB — CBC
HCT: 41.1 % (ref 36.0–46.0)
Hemoglobin: 13.8 g/dL (ref 12.0–15.0)
MCH: 30.3 pg (ref 26.0–34.0)
MCHC: 33.6 g/dL (ref 30.0–36.0)
MCV: 90.3 fL (ref 78.0–100.0)
PLATELETS: 232 10*3/uL (ref 150–400)
RBC: 4.55 MIL/uL (ref 3.87–5.11)
RDW: 12 % (ref 11.5–15.5)
WBC: 10.8 10*3/uL — AB (ref 4.0–10.5)

## 2014-01-15 LAB — HEMOGLOBIN AND HEMATOCRIT, BLOOD
HCT: 41 % (ref 36.0–46.0)
Hemoglobin: 13.8 g/dL (ref 12.0–15.0)

## 2014-01-15 SURGERY — LAPAROSCOPIC ROUX-EN-Y GASTRIC BYPASS WITH UPPER ENDOSCOPY
Anesthesia: General

## 2014-01-15 MED ORDER — OXYCODONE HCL 5 MG/5ML PO SOLN
5.0000 mg | ORAL | Status: DC | PRN
  Administered 2014-01-16 – 2014-01-17 (×4): 10 mg via ORAL
  Filled 2014-01-15 (×4): qty 10

## 2014-01-15 MED ORDER — DEXTROSE 5 % IV SOLN
INTRAVENOUS | Status: AC
  Filled 2014-01-15: qty 2

## 2014-01-15 MED ORDER — PROPOFOL 10 MG/ML IV BOLUS
INTRAVENOUS | Status: DC | PRN
  Administered 2014-01-15: 200 mg via INTRAVENOUS

## 2014-01-15 MED ORDER — CHLORHEXIDINE GLUCONATE CLOTH 2 % EX PADS: 6.0000 | MEDICATED_PAD | Freq: Once | CUTANEOUS | Status: DC

## 2014-01-15 MED ORDER — ACETAMINOPHEN 160 MG/5ML PO SOLN: 325.0000 mg | ORAL | Status: DC | PRN

## 2014-01-15 MED ORDER — LIDOCAINE HCL (CARDIAC) 20 MG/ML IV SOLN
INTRAVENOUS | Status: DC | PRN
  Administered 2014-01-15: 100 mg via INTRAVENOUS

## 2014-01-15 MED ORDER — HEPARIN SODIUM (PORCINE) 5000 UNIT/ML IJ SOLN
5000.0000 [IU] | Freq: Three times a day (TID) | INTRAMUSCULAR | Status: DC
  Administered 2014-01-15 – 2014-01-17 (×5): 5000 [IU] via SUBCUTANEOUS
  Filled 2014-01-15 (×8): qty 1

## 2014-01-15 MED ORDER — HYDROMORPHONE HCL PF 1 MG/ML IJ SOLN
0.5000 mg | INTRAMUSCULAR | Status: DC | PRN
  Administered 2014-01-15 – 2014-01-16 (×7): 0.5 mg via INTRAVENOUS
  Filled 2014-01-15 (×7): qty 1

## 2014-01-15 MED ORDER — PROMETHAZINE HCL 25 MG/ML IJ SOLN
6.2500 mg | INTRAMUSCULAR | Status: AC | PRN
  Administered 2014-01-15 (×2): 12.5 mg via INTRAVENOUS

## 2014-01-15 MED ORDER — LACTATED RINGERS IV SOLN
INTRAVENOUS | Status: DC
  Administered 2014-01-15 (×2): 1000 mL via INTRAVENOUS
  Administered 2014-01-15: 12:00:00 via INTRAVENOUS

## 2014-01-15 MED ORDER — MEPERIDINE HCL 50 MG/ML IJ SOLN: 6.2500 mg | INTRAMUSCULAR | Status: DC | PRN

## 2014-01-15 MED ORDER — HEPARIN SODIUM (PORCINE) 5000 UNIT/ML IJ SOLN
5000.0000 [IU] | INTRAMUSCULAR | Status: AC
  Administered 2014-01-15: 5000 [IU] via SUBCUTANEOUS
  Filled 2014-01-15: qty 1

## 2014-01-15 MED ORDER — SCOPOLAMINE 1 MG/3DAYS TD PT72
1.0000 | MEDICATED_PATCH | TRANSDERMAL | Status: DC
  Administered 2014-01-15: 1.5 mg via TRANSDERMAL

## 2014-01-15 MED ORDER — ACETAMINOPHEN 160 MG/5ML PO SOLN: 650.0000 mg | ORAL | Status: DC | PRN

## 2014-01-15 MED ORDER — UNJURY CHICKEN SOUP POWDER
2.0000 [oz_av] | Freq: Four times a day (QID) | ORAL | Status: DC
Start: 2014-01-17 — End: 2014-01-17

## 2014-01-15 MED ORDER — MIDAZOLAM HCL 2 MG/2ML IJ SOLN
INTRAMUSCULAR | Status: AC
  Filled 2014-01-15: qty 2

## 2014-01-15 MED ORDER — BUPIVACAINE LIPOSOME 1.3 % IJ SUSP
20.0000 mL | Freq: Once | INTRAMUSCULAR | Status: AC
  Administered 2014-01-15: 20 mL
  Filled 2014-01-15: qty 20

## 2014-01-15 MED ORDER — FENTANYL CITRATE 0.05 MG/ML IJ SOLN
INTRAMUSCULAR | Status: AC
Start: 2014-01-15 — End: 2014-01-15
  Filled 2014-01-15: qty 2

## 2014-01-15 MED ORDER — FENTANYL CITRATE 0.05 MG/ML IJ SOLN
25.0000 ug | INTRAMUSCULAR | Status: DC | PRN
  Administered 2014-01-15 (×4): 25 ug via INTRAVENOUS

## 2014-01-15 MED ORDER — SUFENTANIL CITRATE 50 MCG/ML IV SOLN
INTRAVENOUS | Status: AC
  Filled 2014-01-15: qty 1

## 2014-01-15 MED ORDER — NEOSTIGMINE METHYLSULFATE 10 MG/10ML IV SOLN
INTRAVENOUS | Status: DC | PRN
  Administered 2014-01-15: 4 mg via INTRAVENOUS

## 2014-01-15 MED ORDER — PROMETHAZINE HCL 25 MG/ML IJ SOLN
INTRAMUSCULAR | Status: AC
  Filled 2014-01-15: qty 1

## 2014-01-15 MED ORDER — ONDANSETRON HCL 4 MG/2ML IJ SOLN
INTRAMUSCULAR | Status: AC
  Filled 2014-01-15: qty 2

## 2014-01-15 MED ORDER — SUCCINYLCHOLINE CHLORIDE 20 MG/ML IJ SOLN: INTRAMUSCULAR | Status: DC | PRN

## 2014-01-15 MED ORDER — MIDAZOLAM HCL 5 MG/5ML IJ SOLN
INTRAMUSCULAR | Status: DC | PRN
  Administered 2014-01-15: 2 mg via INTRAVENOUS

## 2014-01-15 MED ORDER — KCL IN DEXTROSE-NACL 20-5-0.45 MEQ/L-%-% IV SOLN
INTRAVENOUS | Status: DC
  Administered 2014-01-15 – 2014-01-17 (×4): via INTRAVENOUS
  Filled 2014-01-15 (×7): qty 1000

## 2014-01-15 MED ORDER — PROMETHAZINE HCL 25 MG/ML IJ SOLN
12.5000 mg | Freq: Four times a day (QID) | INTRAMUSCULAR | Status: DC | PRN
  Administered 2014-01-15 – 2014-01-16 (×3): 12.5 mg via INTRAVENOUS
  Filled 2014-01-15 (×3): qty 1

## 2014-01-15 MED ORDER — MORPHINE SULFATE 10 MG/ML IJ SOLN: 2.0000 mg | INTRAMUSCULAR | Status: DC | PRN

## 2014-01-15 MED ORDER — DEXTROSE 5 % IV SOLN
2.0000 g | Freq: Once | INTRAVENOUS | Status: AC
  Administered 2014-01-15: 2 g via INTRAVENOUS
  Filled 2014-01-15: qty 2

## 2014-01-15 MED ORDER — DEXTROSE 5 % IV SOLN
2.0000 g | INTRAVENOUS | Status: AC
  Administered 2014-01-15: 2 g via INTRAVENOUS

## 2014-01-15 MED ORDER — SCOPOLAMINE 1 MG/3DAYS TD PT72
MEDICATED_PATCH | TRANSDERMAL | Status: AC
  Filled 2014-01-15: qty 1

## 2014-01-15 MED ORDER — ONDANSETRON HCL 4 MG/2ML IJ SOLN
4.0000 mg | INTRAMUSCULAR | Status: DC | PRN
  Administered 2014-01-15 – 2014-01-16 (×3): 4 mg via INTRAVENOUS
  Filled 2014-01-15 (×3): qty 2

## 2014-01-15 MED ORDER — CISATRACURIUM BESYLATE 20 MG/10ML IV SOLN
INTRAVENOUS | Status: AC
  Filled 2014-01-15: qty 10

## 2014-01-15 MED ORDER — LACTATED RINGERS IR SOLN
Status: DC | PRN
  Administered 2014-01-15: 1000 mL

## 2014-01-15 MED ORDER — PROPOFOL 10 MG/ML IV BOLUS
INTRAVENOUS | Status: AC
  Filled 2014-01-15: qty 20

## 2014-01-15 MED ORDER — UNJURY CHOCOLATE CLASSIC POWDER
2.0000 [oz_av] | Freq: Four times a day (QID) | ORAL | Status: DC
  Administered 2014-01-17: 2 [oz_av] via ORAL

## 2014-01-15 MED ORDER — SODIUM CHLORIDE 0.9 % IJ SOLN
INTRAMUSCULAR | Status: AC
  Filled 2014-01-15: qty 10

## 2014-01-15 MED ORDER — DEXAMETHASONE SODIUM PHOSPHATE 10 MG/ML IJ SOLN
INTRAMUSCULAR | Status: DC | PRN
  Administered 2014-01-15: 10 mg via INTRAVENOUS

## 2014-01-15 MED ORDER — ATROPINE SULFATE 0.4 MG/ML IJ SOLN
INTRAMUSCULAR | Status: DC | PRN
  Administered 2014-01-15: .3 mg via INTRAVENOUS

## 2014-01-15 MED ORDER — ONDANSETRON HCL 4 MG/2ML IJ SOLN
INTRAMUSCULAR | Status: DC | PRN
  Administered 2014-01-15: 4 mg via INTRAVENOUS

## 2014-01-15 MED ORDER — DEXAMETHASONE SODIUM PHOSPHATE 10 MG/ML IJ SOLN
INTRAMUSCULAR | Status: AC
  Filled 2014-01-15: qty 1

## 2014-01-15 MED ORDER — LIDOCAINE HCL (CARDIAC) 20 MG/ML IV SOLN
INTRAVENOUS | Status: AC
  Filled 2014-01-15: qty 5

## 2014-01-15 MED ORDER — NEOSTIGMINE METHYLSULFATE 10 MG/10ML IV SOLN
INTRAVENOUS | Status: AC
  Filled 2014-01-15: qty 1

## 2014-01-15 MED ORDER — UNJURY VANILLA POWDER: 2.0000 [oz_av] | Freq: Four times a day (QID) | ORAL | Status: DC

## 2014-01-15 MED ORDER — SUCCINYLCHOLINE CHLORIDE 20 MG/ML IJ SOLN
INTRAMUSCULAR | Status: DC | PRN
  Administered 2014-01-15: 100 mg via INTRAVENOUS

## 2014-01-15 MED ORDER — CISATRACURIUM BESYLATE (PF) 10 MG/5ML IV SOLN
INTRAVENOUS | Status: DC | PRN
  Administered 2014-01-15: 6 mg via INTRAVENOUS
  Administered 2014-01-15 (×2): 2 mg via INTRAVENOUS
  Administered 2014-01-15: 12 mg via INTRAVENOUS

## 2014-01-15 MED ORDER — SUFENTANIL CITRATE 50 MCG/ML IV SOLN
INTRAVENOUS | Status: DC | PRN
  Administered 2014-01-15 (×8): 10 ug via INTRAVENOUS
  Administered 2014-01-15: 5 ug via INTRAVENOUS

## 2014-01-15 MED ORDER — 0.9 % SODIUM CHLORIDE (POUR BTL) OPTIME
TOPICAL | Status: DC | PRN
  Administered 2014-01-15: 1000 mL

## 2014-01-15 MED ORDER — GLYCOPYRROLATE 0.2 MG/ML IJ SOLN
INTRAMUSCULAR | Status: AC
  Filled 2014-01-15: qty 3

## 2014-01-15 MED ORDER — GLYCOPYRROLATE 0.2 MG/ML IJ SOLN
INTRAMUSCULAR | Status: DC | PRN
  Administered 2014-01-15: .6 mg via INTRAVENOUS

## 2014-01-15 MED ORDER — CETYLPYRIDINIUM CHLORIDE 0.05 % MT LIQD
7.0000 mL | Freq: Two times a day (BID) | OROMUCOSAL | Status: DC
  Administered 2014-01-15 – 2014-01-16 (×3): 7 mL via OROMUCOSAL

## 2014-01-15 MED ORDER — TISSEEL VH 10 ML EX KIT
PACK | CUTANEOUS | Status: DC | PRN
  Administered 2014-01-15: 10 mL

## 2014-01-15 SURGICAL SUPPLY — 66 items
APPLICATOR COTTON TIP 6IN STRL (MISCELLANEOUS) ×6 IMPLANT
BENZOIN TINCTURE PRP APPL 2/3 (GAUZE/BANDAGES/DRESSINGS) IMPLANT
BLADE SURG 15 STRL LF DISP TIS (BLADE) ×1 IMPLANT
BLADE SURG 15 STRL SS (BLADE) ×2
CABLE HIGH FREQUENCY MONO STRZ (ELECTRODE) IMPLANT
CANISTER SUCTION 2500CC (MISCELLANEOUS) ×3 IMPLANT
CLIP SUT LAPRA TY ABSORB (SUTURE) ×3 IMPLANT
CLOSURE WOUND 1/2 X4 (GAUZE/BANDAGES/DRESSINGS)
DERMABOND ADVANCED (GAUZE/BANDAGES/DRESSINGS) ×2
DERMABOND ADVANCED .7 DNX12 (GAUZE/BANDAGES/DRESSINGS) ×1 IMPLANT
DEVICE SUTURE ENDOST 10MM (ENDOMECHANICALS) ×3 IMPLANT
DISSECTOR BLUNT TIP ENDO 5MM (MISCELLANEOUS) IMPLANT
DRAIN PENROSE 18X1/4 LTX STRL (WOUND CARE) ×3 IMPLANT
DRAPE CAMERA CLOSED 9X96 (DRAPES) ×3 IMPLANT
GAUZE SPONGE 4X4 12PLY STRL (GAUZE/BANDAGES/DRESSINGS) ×3 IMPLANT
GAUZE SPONGE 4X4 16PLY XRAY LF (GAUZE/BANDAGES/DRESSINGS) ×3 IMPLANT
GLOVE BIOGEL M 8.0 STRL (GLOVE) ×3 IMPLANT
GOWN SPEC L4 XLG W/TWL (GOWN DISPOSABLE) ×3 IMPLANT
GOWN STRL REUS W/TWL XL LVL3 (GOWN DISPOSABLE) ×9 IMPLANT
HANDLE STAPLE EGIA 4 XL (STAPLE) ×3 IMPLANT
HOVERMATT SINGLE USE (MISCELLANEOUS) ×3 IMPLANT
KIT BASIN OR (CUSTOM PROCEDURE TRAY) ×3 IMPLANT
KIT GASTRIC LAVAGE 34FR ADT (SET/KITS/TRAYS/PACK) ×3 IMPLANT
NEEDLE SPNL 22GX3.5 QUINCKE BK (NEEDLE) ×3 IMPLANT
NS IRRIG 1000ML POUR BTL (IV SOLUTION) ×3 IMPLANT
PACK CARDIOVASCULAR III (CUSTOM PROCEDURE TRAY) ×3 IMPLANT
PEN SKIN MARKING BROAD (MISCELLANEOUS) ×3 IMPLANT
RELOAD EGIA 45 MED/THCK PURPLE (STAPLE) ×3 IMPLANT
RELOAD EGIA 45 TAN VASC (STAPLE) ×3 IMPLANT
RELOAD EGIA 60 MED/THCK PURPLE (STAPLE) ×6 IMPLANT
RELOAD EGIA 60 TAN VASC (STAPLE) ×3 IMPLANT
RELOAD ENDO STITCH 2.0 (ENDOMECHANICALS) ×18
RELOAD TRI 45 ART MED THCK PUR (STAPLE) ×3 IMPLANT
RELOAD TRI 60 ART MED THCK PUR (STAPLE) ×3 IMPLANT
SCISSORS LAP 5X45 EPIX DISP (ENDOMECHANICALS) ×3 IMPLANT
SEALANT SURGICAL APPL DUAL CAN (MISCELLANEOUS) ×3 IMPLANT
SET IRRIG TUBING LAPAROSCOPIC (IRRIGATION / IRRIGATOR) ×3 IMPLANT
SHEARS CURVED HARMONIC AC 45CM (MISCELLANEOUS) ×3 IMPLANT
SLEEVE ADV FIXATION 12X100MM (TROCAR) ×6 IMPLANT
SLEEVE ADV FIXATION 5X100MM (TROCAR) IMPLANT
SOLUTION ANTI FOG 6CC (MISCELLANEOUS) ×3 IMPLANT
STAPLER VISISTAT 35W (STAPLE) ×3 IMPLANT
STRIP CLOSURE SKIN 1/2X4 (GAUZE/BANDAGES/DRESSINGS) IMPLANT
STRIP PERI DRY VERITAS 45 (STAPLE) IMPLANT
STRIP PERI DRY VERITAS 60 (STAPLE) ×3 IMPLANT
SUT RELOAD ENDO STITCH 2 48X1 (ENDOMECHANICALS) ×5
SUT RELOAD ENDO STITCH 2.0 (ENDOMECHANICALS) ×4
SUT VIC AB 2-0 SH 27 (SUTURE) ×2
SUT VIC AB 2-0 SH 27X BRD (SUTURE) ×1 IMPLANT
SUT VIC AB 4-0 SH 18 (SUTURE) ×3 IMPLANT
SUTURE RELOAD END STTCH 2 48X1 (ENDOMECHANICALS) ×5 IMPLANT
SUTURE RELOAD ENDO STITCH 2.0 (ENDOMECHANICALS) ×4 IMPLANT
SYR 20CC LL (SYRINGE) ×3 IMPLANT
SYR 30ML LL (SYRINGE) ×3 IMPLANT
SYR 50ML LL SCALE MARK (SYRINGE) ×3 IMPLANT
TOWEL OR 17X26 10 PK STRL BLUE (TOWEL DISPOSABLE) ×6 IMPLANT
TOWEL OR NON WOVEN STRL DISP B (DISPOSABLE) ×3 IMPLANT
TRAY FOLEY CATH 14FRSI W/METER (CATHETERS) ×3 IMPLANT
TROCAR ADV FIXATION 12X100MM (TROCAR) ×3 IMPLANT
TROCAR ADV FIXATION 5X100MM (TROCAR) ×3 IMPLANT
TROCAR BLADELESS OPT 5 100 (ENDOMECHANICALS) ×3 IMPLANT
TROCAR XCEL 12X100 BLDLESS (ENDOMECHANICALS) ×3 IMPLANT
TUBING CONNECTING 10 (TUBING) ×2 IMPLANT
TUBING CONNECTING 10' (TUBING) ×1
TUBING ENDO SMARTCAP PENTAX (MISCELLANEOUS) ×3 IMPLANT
TUBING FILTER THERMOFLATOR (ELECTROSURGICAL) ×3 IMPLANT

## 2014-01-15 NOTE — Transfer of Care (Signed)
Immediate Anesthesia Transfer of Care Note  Patient: Nancy Perry  Procedure(s) Performed: Procedure(s): LAPAROSCOPIC ROUX-EN-Y GASTRIC BYPASS WITH UPPER ENDOSCOPY (N/A)  Patient Location: PACU  Anesthesia Type:General  Level of Consciousness: awake, alert  and oriented  Airway & Oxygen Therapy: Patient Spontanous Breathing and Patient connected to face mask oxygen  Post-op Assessment: Report given to PACU RN and Post -op Vital signs reviewed and stable  Post vital signs: Reviewed and stable  Complications: No apparent anesthesia complications

## 2014-01-15 NOTE — Progress Notes (Signed)
PACU note----pt still c/o nausea; Dr. Kalman Shan notified, order rec'd and med given

## 2014-01-15 NOTE — H&P (View-Only) (Signed)
Chief Complaint: Morbid obesity; BMI 38 with multiple comorbidities  History of Present Illness: Nancy Perry is an 53 y.o. female who lives in [place] and who works for [employer] has been to 3 of our seminars and presents to discuss roux en Y gastric bypass. She is followed by Dr. Glendale Chard in [place]. She has borderline diabetes according to him, hypertension, GERD, and obstructive sleep apnea requiring CPAP. She has had difficulty with CPAP and keeping the mask on at night. Her sister had bypass surgery leading to complete resolution of her diabetes.  I discussed the procedure with her in some detail including risk and benefits and gave her our comprehensive informed consent document.  Past Medical History   Diagnosis  Date   .  OSA on CPAP    .  Depression    .  GERD (gastroesophageal reflux disease)    .  Rotator cuff tear      work related accident (Left shoulder)   .  Back pain, chronic    .  Hypertension     Past Surgical History   Procedure  Laterality  Date   .  Tonsillectomy   1975   .  Abdominal hysterectomy   1998    Current Outpatient Prescriptions   Medication  Sig  Dispense  Refill   .  cyclobenzaprine (FLEXERIL) 10 MG tablet      .  DULoxetine (CYMBALTA) 60 MG capsule      .  fluticasone (VERAMYST) 27.5 MCG/SPRAY nasal spray  Place 2 sprays into the nose daily.     Marland Kitchen  losartan-hydrochlorothiazide (HYZAAR) 100-12.5 MG per tablet      .  oxyCODONE-acetaminophen (PERCOCET) 10-325 MG per tablet      .  ranitidine (ZANTAC) 300 MG tablet       No current facility-administered medications for this visit.   Review of patient's allergies indicates no known allergies.  No family history on file.  Social History: reports that she quit smoking about 17 months ago. She does not have any smokeless tobacco history on file. She reports that she does not drink alcohol. Her drug history is not on file.  REVIEW OF SYSTEMS - PERTINENT POSITIVES ONLY:  Positive  for loss of sleep and fatigue, borderline diabetes, hypertension, hypertension or hiatal hernia with reflux, headaches, ear infection, sinus problems, and glass. Otherwise there is systems is negative. She has tried multiple diets including Phen-Fen twice, Weight Watchers, Slim fast, high protein [employer], Metabolife. She has been frustrated by all of these.  Physical Exam:  Blood pressure 122/78, pulse 71, temperature 98.3 F (36.8 C), temperature source Temporal, resp. rate 16, height 5\' 3"  (1.6 m), weight 216 lb 12.8 oz (98.34 kg).  Body mass index is 38.41 kg/(m^2).  Gen: WDWN white female NAD  Neurological: Alert and oriented to person, place, and time. Motor and sensory function is grossly intact  Head: Normocephalic and atraumatic.  Eyes: Conjunctivae are normal. Pupils are equal, round, and reactive to light. No scleral icterus.  Neck: Normal range of motion. Neck supple. No tracheal deviation or thyromegaly present.  Cardiovascular: SR without murmurs or gallops. No carotid bruits  Respiratory: Effort normal. No respiratory distress. No chest wall tenderness. Breath sounds normal. No wheezes, rales or rhonchi.  Abdomen: nontender obese  GU:  Musculoskeletal: Normal range of motion. Extremities are nontender. No cyanosis, edema or clubbing noted Lymphadenopathy: No cervical, preauricular, postauricular or axillary adenopathy is present Skin: Skin is warm and  dry. No rash noted. No diaphoresis. No erythema. No pallor. Pscyh: Normal mood and affect. Behavior is normal. Judgment and thought content normal.  LABORATORY RESULTS:  No results found for this or any previous visit (from the past 48 hour(s)).  RADIOLOGY RESULTS:  Gallbladder:  Within the gallbladder, there are 2 echogenic foci which move and  shadow consistent with gallstones. One of these foci measures 1.8 x  1.8 x 2.1 cm. A second focus measures 1.6 x 1.5 x 1.4 cm.  Gallbladder wall is not thickened, and there is no  pericholecystic  fluid. No sonographic Murphy sign noted.  No results found.  Patient Active Problem List   Diagnosis Date Noted  . Morbid obesity 09/12/2013  . Essential hypertension, benign 09/12/2013  . OSA on CPAP 09/12/2013  . GERD (gastroesophageal reflux disease) 09/12/2013   Assessment & Plan:  Morbid obesity with multiple comorbidities and I think is a good candidate for gastric bypass. She had a lap chole at [facility] last month.   No evidence of a hiatal hernia. We plan laparoscopic Roux-en-Y on September 1. Matt B. Hassell Done, MD, [facility]  [phone] beeper  [phone]

## 2014-01-15 NOTE — Interval H&P Note (Signed)
History and Physical Interval Note:  01/15/2014 10:37 AM  Nancy Perry  has presented today for surgery, with the diagnosis of Morbid Obesity  The various methods of treatment have been discussed with the patient and family. After consideration of risks, benefits and other options for treatment, the patient has consented to  Procedure(s): LAPAROSCOPIC ROUX-EN-Y GASTRIC BYPASS WITH UPPER ENDOSCOPY (N/A) as a surgical intervention .  The patient's history has been reviewed, patient examined, no change in status, stable for surgery.  I have reviewed the patient's chart and labs.  Questions were answered to the patient's satisfaction.     Chastin Riesgo B

## 2014-01-15 NOTE — Op Note (Signed)
Name:  [name] MRN: [id number] Date of Surgery: 01/15/2014  Preop Diagnosis:  Morbid Obesity, S/P RYGB  Postop Diagnosis:  Morbid Obesity (Weight - 222, BMI - 39.4), S/P RYGB  Procedure:  Upper endoscopy  (Intraoperative)  Surgeon:  Alphonsa Overall, M.D.  Anesthesia:  GET  Indications for procedure: Nancy Perry is a 53 y.o. female whose primary care physician is Virl Son., MD and has completed a Roux-en-Y gastric bypass today by Dr. Hassell Done.  We are in room #1 at [facility].   I am doing an intraoperative upper endoscopy to evaluate the gastric pouch and the gastro-jejunal anastomosis.  Operative Note: The patient is under general anesthesia.  Dr. Hassell Done is laparoscoping the patient while I do an upper endoscopy to evaluate the stomach pouch and gastrojejunal anastomosis.  With the patient intubated, I passed the Pentax endoscope without difficulty down the esophagus.  The esophago-gastric junction was at 40 cm.  The gastro-jejunal anastomosis was at 45 cm.  The mucosa of the stomach looked viable and the staple line was intact without bleeding.  The gastro-jejunal anastomosis looked okay.  While I insufflated the stomach pouch with air, Dr. Hassell Done clamped off the efferent limb of the jejunum.  He then flooded the upper abdomen with saline to put the gastric pouch and gastro-jejunal anastomosis under saline.  There was no bubbling or evidence of a leak.  Photos were taken of the anastomosis.  The scope was then withdrawn.  The esophagus was unremarkable and the patient tolerated the endoscopy without difficulty.  Alphonsa Overall, MD, [facility] Pager: [phone] Office phone:  [phone]

## 2014-01-15 NOTE — Anesthesia Preprocedure Evaluation (Addendum)
Anesthesia Evaluation  Patient identified by MRN, date of birth, ID band Patient awake    Reviewed: Allergy & Precautions, H&P , NPO status , Patient's Chart, lab work & pertinent test results  Airway       Dental   Pulmonary sleep apnea , former smoker,          Cardiovascular hypertension, Pt. on medications negative cardio ROS      Neuro/Psych PSYCHIATRIC DISORDERS Depression negative neurological ROS     GI/Hepatic Neg liver ROS, GERD-  ,  Endo/Other  negative endocrine ROSMorbid obesity  Renal/GU negative Renal ROS     Musculoskeletal negative musculoskeletal ROS (+)   Abdominal   Peds  Hematology negative hematology ROS (+)   Anesthesia Other Findings   Reproductive/Obstetrics negative OB ROS                           Anesthesia Physical Anesthesia Plan  ASA: III  Anesthesia Plan: General   Post-op Pain Management:    Induction: Intravenous  Airway Management Planned: Oral ETT  Additional Equipment:   Intra-op Plan:   Post-operative Plan: Extubation in OR  Informed Consent: I have reviewed the patients History and Physical, chart, labs and discussed the procedure including the risks, benefits and alternatives for the proposed anesthesia with the patient or authorized representative who has indicated his/her understanding and acceptance.   Dental advisory given  Plan Discussed with: CRNA  Anesthesia Plan Comments:         Anesthesia Quick Evaluation

## 2014-01-15 NOTE — Op Note (Signed)
Surgeon: Althea Grimmer. Hassell Done, MD, FACS Asst:  Alphonsa Overall, MD,FACS Anesthesia: General endotracheal Drains: None  Procedure: Laparoscopic Roux en Y gastric bypass with 40 cm BP limb and 100 cm Roux limb, antecolic, antegastric, candy cane to the left.  Closure of Peterson's defect. Upper endoscopy.   Description of Procedure:  The patient was taken to OR 1 at [facility] and given general anesthesia.  The abdomen was prepped with PCMX and draped sterilely.  A time out was performed.    The operation began by identifying the ligament of Treitz. I measured 40 cm downstream and divided the bowel with a 6 cm Covidian stapler.  I sutured a Penrose drain along the Roux limb end.  I measured a 1 meter (100 cm) Roux limb and then placed the distal bowels to the BP limb side by side and performed a stapled jejunojejunostomy. The common defect was closed from either end with 4-0 Vicryl using the Endo Stitch. The mesenteric defect was closed with a running 2-0 silk using the Endo Stitch. Tisseel was applied to the suture line.  The omentum was divided with the harmonic scalpel.  The Nathanson retractor was inserted in the left lateral segment of liver was retracted. The foregut dissection ensued.  I measured 5 cm along the lessor curvature and took down the blood supply.  I entered the retrogastric space.  The pouch was created with 2 firings of the purple Covidien load followed by two applications of the buttressed purple loads.  A small pouch was present and hemostasis was present.  The Roux limb was then brought up with the candycane pointed left and a back row of sutures of 2-0 Vicryl were placed. I opened along the right side of each structure and inserted the 4.5 cm stapler to create the gastrojejunostomy. The common defect was closed from either end with 2-0 Vicryl and a second row was placed anterior to that the Ewald tube acting as a stent across the anastomosis. The Penrose drain was removed. Peterson's defect was  closed with 2-0 silk.   Endoscopy was performed by Dr. Alphonsa Overall and no bubbles were seen and no bleeding was noted.  The pouch was 5 cm long  The incisions were injected with Exparel and were closed with 4-0 Vicryl and steristrips.  The patient was taken to the recovery room in satisfactory condition.  Matt B. Hassell Done, MD, FACS

## 2014-01-16 ENCOUNTER — Encounter (HOSPITAL_COMMUNITY): Payer: Self-pay | Admitting: Surgery

## 2014-01-16 ENCOUNTER — Inpatient Hospital Stay (HOSPITAL_COMMUNITY): Payer: BC Managed Care – PPO

## 2014-01-16 LAB — CBC WITH DIFFERENTIAL/PLATELET
BASOS ABS: 0 10*3/uL (ref 0.0–0.1)
BASOS PCT: 0 % (ref 0–1)
Eosinophils Absolute: 0 10*3/uL (ref 0.0–0.7)
Eosinophils Relative: 0 % (ref 0–5)
HCT: 36.6 % (ref 36.0–46.0)
Hemoglobin: 12.2 g/dL (ref 12.0–15.0)
Lymphocytes Relative: 18 % (ref 12–46)
Lymphs Abs: 1.5 10*3/uL (ref 0.7–4.0)
MCH: 29.7 pg (ref 26.0–34.0)
MCHC: 33.3 g/dL (ref 30.0–36.0)
MCV: 89.1 fL (ref 78.0–100.0)
Monocytes Absolute: 0.6 10*3/uL (ref 0.1–1.0)
Monocytes Relative: 7 % (ref 3–12)
NEUTROS ABS: 6.5 10*3/uL (ref 1.7–7.7)
NEUTROS PCT: 75 % (ref 43–77)
Platelets: 258 10*3/uL (ref 150–400)
RBC: 4.11 MIL/uL (ref 3.87–5.11)
RDW: 12 % (ref 11.5–15.5)
WBC: 8.6 10*3/uL (ref 4.0–10.5)

## 2014-01-16 LAB — HEMOGLOBIN AND HEMATOCRIT, BLOOD
HEMATOCRIT: 38.6 % (ref 36.0–46.0)
Hemoglobin: 12.8 g/dL (ref 12.0–15.0)

## 2014-01-16 MED ORDER — DIPHENHYDRAMINE HCL 12.5 MG/5ML PO ELIX: 12.5000 mg | ORAL_SOLUTION | Freq: Four times a day (QID) | ORAL | Status: DC | PRN

## 2014-01-16 MED ORDER — IOHEXOL 300 MG/ML  SOLN
50.0000 mL | Freq: Once | INTRAMUSCULAR | Status: AC | PRN
  Administered 2014-01-16: 50 mL via ORAL

## 2014-01-16 NOTE — Progress Notes (Signed)
Patient continues to feel flushed. VS are stable. No s/s of  Distress. Will continue to monitor.

## 2014-01-16 NOTE — Progress Notes (Signed)
Patient complained of feeling hot and flushed after receiving contrast for UGI. She stated she thinks she is having a reaction. Assessed patient, obtained vitals. Notified MD. Will continue to monitor.

## 2014-01-16 NOTE — Progress Notes (Signed)
Nutrition Education Note  Received consult for diet education per DROP protocol.   Discussed 2 week post op diet with pt. Emphasized that liquids must be non carbonated, non caffeinated, and sugar free. Fluid goals discussed. Pt to follow up with outpatient bariatric RD for further diet progression after 2 weeks. Multivitamins and minerals also reviewed. Teach back method used, pt expressed understanding, expect good compliance.   Diet: First 2 Weeks  You will see the nutritionist about two (2) weeks after your surgery. The nutritionist will increase the types of foods you can eat if you are handling liquids well:  If you have severe vomiting or nausea and cannot handle clear liquids lasting longer than 1 day, call your surgeon  Protein Shake  Drink at least 2 ounces of shake 5-6 times per day  Each serving of protein shakes (usually 8 - 12 ounces) should have a minimum of:  15 grams of protein  And no more than 5 grams of carbohydrate  Goal for protein each day:  Men = 80 grams per day  Women = 60 grams per day  Protein powder may be added to fluids such as non-fat milk or Lactaid milk or Soy milk (limit to 35 grams added protein powder per serving)   Hydration  Slowly increase the amount of water and other clear liquids as tolerated (See Acceptable Fluids)  Slowly increase the amount of protein shake as tolerated  Sip fluids slowly and throughout the day  May use sugar substitutes in small amounts (no more than 6 - 8 packets per day; i.e. Splenda)   Fluid Goal  The first goal is to drink at least 8 ounces of protein shake/drink per day (or as directed by the nutritionist); some examples of protein shakes are [employer] Johnson, [employer]R [employer], EAS Edge HP, and Unjury. See handout from pre-op Bariatric Education Class:  Slowly increase the amount of protein shake you drink as tolerated  You may find it easier to slowly sip shakes throughout the day  It is important to get your proteins  in first  Your fluid goal is to drink 64 - 100 ounces of fluid daily  It may take a few weeks to build up to this  32 oz (or more) should be clear liquids  And  32 oz (or more) should be full liquids (see below for examples)  Liquids should not contain sugar, caffeine, or carbonation   Clear Liquids:  Water or Sugar-free flavored water (i.e. Fruit H2O, Propel)  Decaffeinated coffee or tea (sugar-free)  Crystal Lite, Wyler's Lite, Minute Maid Lite  Sugar-free Jell-O  Bouillon or broth  Sugar-free Popsicle: *Less than 20 calories each; Limit 1 per day   Full Liquids:  Protein Shakes/Drinks + 2 choices per day of other full liquids  Full liquids must be:  No More Than 12 grams of Carbs per serving  No More Than 3 grams of Fat per serving  Strained low-fat cream soup  Non-Fat milk  Fat-free Lactaid Milk  Sugar-free yogurt (Dannon Lite & Fit, [place] yogurt)     [place] MS, RD, [place] [phone] Pager [phone] Weekend/After Hours Pager

## 2014-01-16 NOTE — Progress Notes (Signed)
Patient ID: Nancy Perry, female   DOB: 1960-05-19, 53 y.o.   MRN: [id number] [facility] Progress Note:   1 Day Post-Op  Subjective: Mental status is clear.  Objective: Vital signs in last 24 hours: Temp:  [97.5 F (36.4 C)-98.6 F (37 C)] 97.5 F (36.4 C) (09/02 0503) Pulse Rate:  [61-98] 61 (09/02 0503) Resp:  [13-21] 18 (09/02 0503) BP: (126-170)/(61-109) 126/61 mmHg (09/02 0503) SpO2:  [92 %-100 %] 92 % (09/02 0503)  Intake/Output from previous day: 09/01 0701 - 09/02 0700 In: 3148.3 [I.V.:3048.3; IV Piggyback:100] Out: 1450 [Urine:1400; Blood:50] Intake/Output this shift:    Physical Exam: Work of breathing is normal.  Minimal pain.    Lab Results:  Results for orders placed during the hospital encounter of 01/15/14 (from the past 48 hour(s))  HEMOGLOBIN AND HEMATOCRIT, BLOOD     Status: None   Collection Time    01/15/14  2:59 PM      Result Value Ref Range   Hemoglobin 13.8  12.0 - 15.0 g/dL   HCT 41.0  36.0 - 46.0 %  CBC     Status: Abnormal   Collection Time    01/15/14  5:40 PM      Result Value Ref Range   WBC 10.8 (*) 4.0 - 10.5 K/uL   RBC 4.55  3.87 - 5.11 MIL/uL   Hemoglobin 13.8  12.0 - 15.0 g/dL   HCT 41.1  36.0 - 46.0 %   MCV 90.3  78.0 - 100.0 fL   MCH 30.3  26.0 - 34.0 pg   MCHC 33.6  30.0 - 36.0 g/dL   RDW 12.0  11.5 - 15.5 %   Platelets 232  150 - 400 K/uL  CREATININE, SERUM     Status: None   Collection Time    01/15/14  5:40 PM      Result Value Ref Range   Creatinine, Ser 0.58  0.50 - 1.10 mg/dL   GFR calc non Af Amer >90  >90 mL/min   GFR calc Af Amer >90  >90 mL/min   Comment: (NOTE)     The eGFR has been calculated using the CKD EPI equation.     This calculation has not been validated in all clinical situations.     eGFR's persistently <90 mL/min signify possible Chronic Kidney     Disease.  CBC WITH DIFFERENTIAL     Status: None   Collection Time    01/16/14  5:00 AM      Result Value Ref Range   WBC 8.6  4.0  - 10.5 K/uL   RBC 4.11  3.87 - 5.11 MIL/uL   Hemoglobin 12.2  12.0 - 15.0 g/dL   HCT 36.6  36.0 - 46.0 %   MCV 89.1  78.0 - 100.0 fL   MCH 29.7  26.0 - 34.0 pg   MCHC 33.3  30.0 - 36.0 g/dL   RDW 12.0  11.5 - 15.5 %   Platelets 258  150 - 400 K/uL   Neutrophils Relative % 75  43 - 77 %   Neutro Abs 6.5  1.7 - 7.7 K/uL   Lymphocytes Relative 18  12 - 46 %   Lymphs Abs 1.5  0.7 - 4.0 K/uL   Monocytes Relative 7  3 - 12 %   Monocytes Absolute 0.6  0.1 - 1.0 K/uL   Eosinophils Relative 0  0 - 5 %   Eosinophils Absolute 0.0  0.0 - 0.7 K/uL  Basophils Relative 0  0 - 1 %   Basophils Absolute 0.0  0.0 - 0.1 K/uL    Radiology/Results: No results found.  Anti-infectives: Anti-infectives   Start     Dose/Rate Route Frequency Ordered Stop   01/15/14 1315  cefOXitin (MEFOXIN) 2 g in dextrose 5 % 50 mL IVPB     2 g 100 mL/hr over 30 Minutes Intravenous  Once 01/15/14 1309 01/15/14 1315   01/15/14 0833  cefOXitin (MEFOXIN) 2 g in dextrose 5 % 50 mL IVPB     2 g 100 mL/hr over 30 Minutes Intravenous On call to O.R. 01/15/14 0833 01/15/14 1115      Assessment/Plan: Problem List: Patient Active Problem List   Diagnosis Date Noted  . 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 Morbid obesity 09/12/2013  . Essential hypertension, benign 09/12/2013  . OSA on CPAP 09/12/2013  . GERD (gastroesophageal reflux disease) 09/12/2013    Awaiting UGI;  Will advance diet after review. 1 Day Post-Op    LOS: 1 day   Matt B. Hassell Done, MD, [facility] [phone] beeper [phone]  01/16/2014 8:40 AM

## 2014-01-16 NOTE — Anesthesia Postprocedure Evaluation (Signed)
Anesthesia Post Note  Patient: Nancy Perry  Procedure(s) Performed: Procedure(s) (LRB): LAPAROSCOPIC ROUX-EN-Y GASTRIC BYPASS WITH UPPER ENDOSCOPY (N/A)  Anesthesia type: General  Patient location: PACU  Post pain: Pain level controlled  Post assessment: Post-op Vital signs reviewed  Last Vitals: BP 126/61  Pulse 61  Temp(Src) 36.4 C (Oral)  Resp 18  Ht 5\' 3"  (1.6 m)  Wt 217 lb 3.2 oz (98.521 kg)  BMI 38.48 kg/m2  SpO2 92%  Post vital signs: Reviewed  Level of consciousness: sedated  Complications: No apparent anesthesia complications

## 2014-01-16 NOTE — Progress Notes (Signed)
Patient alert and oriented, Post op day 1.  Provided support and encouragement.  Encouraged pulmonary toilet, ambulation and small sips of liquids when swallow study returned satisfactorily.  All questions answered.  Will continue to monitor. 

## 2014-01-16 NOTE — Care Management Note (Signed)
    Page 1 of 1   01/16/2014     11:01:19 AM CARE MANAGEMENT NOTE 01/16/2014  Patient:  Nancy Perry,Nancy A.   Account Number:  [id number]  Date Initiated:  01/16/2014  Documentation initiated by:  Lizandra Zakrzewski  Subjective/Objective Assessment:   53 yo female admitted s/p Lap roux en Y gastric bypass. PTA lived at home with spouse.     Action/Plan:   Home when stable   Anticipated DC Date:  01/18/2014   Anticipated DC Plan:  [place]  CM consult      Choice offered to / List presented to:             Status of service:  Completed, signed off Medicare Important Message given?   (If response is "NO", the following Medicare IM given date fields will be blank) Date Medicare IM given:   Medicare IM given by:   Date Additional Medicare IM given:   Additional Medicare IM given by:    Discharge Disposition:  HOME/SELF CARE  Per UR Regulation:  Reviewed for med. necessity/level of care/duration of stay  If discussed at [place] of Stay Meetings, dates discussed:    Comments:

## 2014-01-17 LAB — CBC WITH DIFFERENTIAL/PLATELET
Basophils Absolute: 0 10*3/uL (ref 0.0–0.1)
Basophils Relative: 0 % (ref 0–1)
EOS PCT: 0 % (ref 0–5)
Eosinophils Absolute: 0 10*3/uL (ref 0.0–0.7)
HCT: 38 % (ref 36.0–46.0)
Hemoglobin: 12.7 g/dL (ref 12.0–15.0)
Lymphocytes Relative: 22 % (ref 12–46)
Lymphs Abs: 1.7 10*3/uL (ref 0.7–4.0)
MCH: 30.2 pg (ref 26.0–34.0)
MCHC: 33.4 g/dL (ref 30.0–36.0)
MCV: 90.3 fL (ref 78.0–100.0)
Monocytes Absolute: 0.8 10*3/uL (ref 0.1–1.0)
Monocytes Relative: 11 % (ref 3–12)
NEUTROS ABS: 5.1 10*3/uL (ref 1.7–7.7)
Neutrophils Relative %: 67 % (ref 43–77)
PLATELETS: 214 10*3/uL (ref 150–400)
RBC: 4.21 MIL/uL (ref 3.87–5.11)
RDW: 12.2 % (ref 11.5–15.5)
WBC: 7.6 10*3/uL (ref 4.0–10.5)

## 2014-01-17 NOTE — Discharge Summary (Signed)
Physician Discharge Summary  Patient ID: Nancy Perry MRN: [id number] DOB/AGE: 09/13/1960 53 y.o.  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  UGI on PD 1 looked good.  Began on liquids and ready for discharge on PD 2.    Consults: none  Significant Diagnostic Studies: UGI    Discharge Exam: Blood pressure 151/79, pulse 71, temperature 98.4 F (36.9 C), temperature source Oral, resp. rate 16, height 5\' 3"  (1.6 m), weight 217 lb 3.2 oz (98.521 kg), SpO2 98.00%. Incisions OK.  Disposition:   Discharge Instructions   Call MD for:  persistant nausea and vomiting    Complete by:  As directed      Call MD for:  severe uncontrolled pain    Complete by:  As directed      Diet - low sodium heart healthy    Complete by:  As directed      Discharge instructions    Complete by:  As directed   Follow bariatric dietary instructions Take Hycet liquid as prescribed and given to you preop for pain     Discharge wound care:    Complete by:  As directed   May shower when you get home     Increase activity slowly    Complete by:  As directed             Medication List    STOP taking these medications       HYDROcodone-acetaminophen 7.5-325 MG per tablet  Commonly known as:  NORCO     oxyCODONE-acetaminophen 10-325 MG per tablet  Commonly known as:  PERCOCET      TAKE these medications       DULoxetine 60 MG capsule  Commonly known as:  CYMBALTA  Take 60 mg by mouth every morning.     fluticasone 27.5 MCG/SPRAY nasal spray  Commonly known as:  VERAMYST  Place 2 sprays into the nose daily.     losartan-hydrochlorothiazide 100-12.5 MG per tablet  Commonly known as:  HYZAAR  Take 1 tablet by mouth every morning.     ranitidine 150  MG tablet  Commonly known as:  ZANTAC  Take 150 mg by mouth daily.     zolpidem 10 MG tablet  Commonly known as:  AMBIEN  Take 10 mg by mouth at bedtime as needed for sleep.           Follow-up Information   Follow up with Pedro Earls, MD.   Specialty:  General Surgery   Contact information:   [address] [place] [address] [phone]       Signed: Pedro Earls 01/17/2014, 8:49 AM

## 2014-01-17 NOTE — Progress Notes (Signed)
Patient alert and oriented, pain is controlled. Patient is tolerating fluids, advanced to protein shake today. Reviewed Gastric Bypass discharge instructions with patient and patient is able to articulate understanding. Provided information on BELT program, Support Group and [facility] outpatient pharmacy. All questions answered, will continue to monitor.    

## 2014-01-17 NOTE — Progress Notes (Signed)
Discharge instructions reviewed with patient and family, vital signs are stable, incisions are within normal limits, patient has tolerated her shakes well, patient with no complaints of nausea or vomiting, patient up and ambulating, questions and concerns answered Neta Mends RN 01-17-2014 11:09am

## 2014-01-17 NOTE — Discharge Instructions (Signed)

## 2014-01-18 ENCOUNTER — Telehealth (HOSPITAL_COMMUNITY): Payer: Self-pay

## 2014-01-18 NOTE — Telephone Encounter (Signed)
Made discharge phone call to patient per DROP protocol. Asking the following questions.    1. Do you have someone to care for you now that you are home?  yes 2. Are you having pain now that is not relieved by your pain medication?  no 3. Are you able to drink the recommended daily amount of fluids (48 ounces minimum/day) and protein (60-80 grams/day) as prescribed by the dietitian or nutritional counselor?  So far yes 4. Are you taking the vitamins and minerals as prescribed?  yes 5. Do you have the "on call" number to contact your surgeon if you have a problem or question?  yes 6. Are your incisions free of redness, swelling or drainage? (If steri strips, address that these can fall off, shower as tolerated) yes 7. Have your bowels moved since your surgery?  If not, are you passing gas?  yes 8. Are you up and walking 3-4 times per day?  yes    1. Do you have an appointment made to see your surgeon in the next month?  yes 2. Were you provided your discharge medications before your surgery or before you were discharged from the hospital and are you taking them without problem?  yes 3. Were you provided phone numbers to the clinic/surgeon's office?  yes 4. Did you watch the patient education video module in the (clinic, surgeon's office, etc.) before your surgery? yes 5. Do you have a discharge checklist that was provided to you in the hospital to reference with instructions on how to take care of yourself after surgery?  yes 6. Did you see a dietitian or nutritional counselor while you were in the hospital?  yes 7. Do you have an appointment to see a dietitian or nutritional counselor in the next month?  yes

## 2014-01-29 ENCOUNTER — Encounter: Payer: BC Managed Care – PPO | Attending: Surgery

## 2014-01-29 DIAGNOSIS — Z713 Dietary counseling and surveillance: Secondary | ICD-10-CM | POA: Diagnosis not present

## 2014-01-29 DIAGNOSIS — Z6839 Body mass index (BMI) 39.0-39.9, adult: Secondary | ICD-10-CM | POA: Insufficient documentation

## 2014-01-29 NOTE — Patient Instructions (Signed)
Patient to follow Phase 3A-Soft, High Protein Diet and follow-up at [facility] in 6 weeks for 2 months post-op nutrition visit for diet advancement. 

## 2014-01-29 NOTE — Progress Notes (Signed)
Bariatric Class:  Appt start time: 1530 end time:  1630.  2 Week Post-Operative Nutrition Class  Patient was seen on 01/29/14 for Post-Operative Nutrition education at [facility].   Surgery date: 01/15/14 Surgery type: RYGB Start weight at [facility]: 224 lbs on 8/10//15 Weight today: 224 lbs Weight change: 13 lbs Total weight loss: 13 lbs  TANITA  BODY COMP RESULTS  12/24/13 01/29/14   BMI (kg/m^2) 39.7 37.4   Fat Mass (lbs) 112 100.0   Fat Free Mass (lbs) 112 111.0   Total Body Water (lbs) 82 81.5    The following the learning objectives were met by the patient during this course:  Identifies Phase 3A (Soft, High Proteins) Dietary Goals and will begin from 2 weeks post-operatively to 2 months post-operatively  Identifies appropriate sources of fluids and proteins   States protein recommendations and appropriate sources post-operatively  Identifies the need for appropriate texture modifications, mastication, and bite sizes when consuming solids  Identifies appropriate multivitamin and calcium sources post-operatively  Describes the need for physical activity post-operatively and will follow MD recommendations  States when to call healthcare provider regarding medication questions or post-operative complications  Handouts given during class include:  Phase 3A: Soft, High Protein Diet Handout  Follow-Up Plan: Patient will follow-up at [facility] in 6 weeks for 2 month post-op nutrition visit for diet advancement per MD.

## 2014-02-14 ENCOUNTER — Ambulatory Visit (INDEPENDENT_AMBULATORY_CARE_PROVIDER_SITE_OTHER): Payer: BC Managed Care – PPO | Admitting: Surgery

## 2014-02-22 ENCOUNTER — Ambulatory Visit (INDEPENDENT_AMBULATORY_CARE_PROVIDER_SITE_OTHER): Payer: BC Managed Care – PPO | Admitting: Surgery

## 2014-03-11 ENCOUNTER — Encounter: Payer: BC Managed Care – PPO | Attending: Surgery | Admitting: Dietician

## 2014-03-11 DIAGNOSIS — Z6834 Body mass index (BMI) 34.0-34.9, adult: Secondary | ICD-10-CM | POA: Insufficient documentation

## 2014-03-11 DIAGNOSIS — Z713 Dietary counseling and surveillance: Secondary | ICD-10-CM | POA: Insufficient documentation

## 2014-03-11 NOTE — Progress Notes (Signed)
  Follow-up visit:  8 Weeks Post-Operative RYGB Surgery  Medical Nutrition Therapy:  Appt start time: 325 end time:  350  Primary concerns today: Post-operative Bariatric Surgery Nutrition Management. Nancy Perry returns having lost 27.5 lbs since last visit. She recently had surgery on shoulder and gallbladder removal in addition to bariatric surgery. She has not been able to exercise, but doing physical therapy. Cannot tolerate chicken.   Surgery date: 01/15/14 Surgery type: RYGB Start weight at [facility]: 224 lbs on 8/10//15 (232 per patient) Weight today: 196.5 lbs Weight change: 27.5 lbs lbs Total weight loss: 35.5 lbs Goal weight: 125-130 lbs  TANITA  BODY COMP RESULTS  12/24/13 01/29/14 03/11/14   BMI (kg/m^2) 39.7 37.4 34.8   Fat Mass (lbs) 112 100.0 87   Fat Free Mass (lbs) 112 111.0 109.5   Total Body Water (lbs) 82 81.5 80    Preferred Learning Style:   No preference indicated   Learning Readiness:   Ready  24-hr recall: B (AM): [employer] ham and cheese (9g) Snk (AM): Premier protein shake (30g)  L (PM): cheese stick (6g) Snk (PM):   D (PM): 1 or 2 cheese sticks, 1 oz almonds OR 3 oz pork chop with vegetables   Snk (PM): sometimes 1/2 Premier protein shake  Fluid intake: 11 oz protein shake, 50 oz water and decaf coffee  Estimated total protein intake: 60g per day  Medications: see list (only ambien and cymbalta) Supplementation: taking  Using straws: no Drinking while eating: no Hair loss: some (3 recent surgeries) Carbonated beverages: no N/V/D/C: nausea and vomiting when eating too fast Dumping syndrome: 1x; cannot remember what she ate  Recent physical activity:  Physical therapy for shoulder  Progress Towards Goal(s):  In progress.  Handouts given during visit include:  Phase 3B lean protein + non-starchy vegetables   Nutritional Diagnosis:  [place]-3.3 Overweight/obesity related to past poor dietary habits and physical inactivity as evidenced by patient  w/ recent RYGB surgery following dietary guidelines for continued weight loss.     Intervention:  Nutrition counseling provided.  Teaching Method Utilized:  Visual Auditory  Barriers to learning/adherence to lifestyle change: multiple recent surgeries  Demonstrated degree of understanding via:  Teach Back   Monitoring/Evaluation:  Dietary intake, exercise, and body weight. Follow up in 2 months for 4 month post-op visit.

## 2014-03-11 NOTE — Patient Instructions (Signed)
Goals:  Follow Phase 3B: High Protein + Non-Starchy Vegetables  Eat 3-6 small meals/snacks, every 3-5 hrs  Increase lean protein foods to meet 60g goal  Increase fluid intake to 64oz +  Avoid drinking 15 minutes before, during and 30 minutes after eating  Aim for >30 min of physical activity daily   TANITA  BODY COMP RESULTS  12/24/13 01/29/14 03/11/14   BMI (kg/m^2) 39.7 37.4 34.8   Fat Mass (lbs) 112 100.0 87   Fat Free Mass (lbs) 112 111.0 109.5   Total Body Water (lbs) 82 81.5 80

## 2014-05-13 ENCOUNTER — Ambulatory Visit: Payer: BC Managed Care – PPO | Admitting: Dietician

## 2014-12-11 ENCOUNTER — Other Ambulatory Visit (HOSPITAL_COMMUNITY): Payer: Self-pay | Admitting: Family Medicine

## 2014-12-11 DIAGNOSIS — Z1231 Encounter for screening mammogram for malignant neoplasm of breast: Secondary | ICD-10-CM

## 2015-01-03 ENCOUNTER — Ambulatory Visit (HOSPITAL_COMMUNITY)
Admission: RE | Admit: 2015-01-03 | Discharge: 2015-01-03 | Disposition: A | Discharge status: Home / Self Care | Payer: BC Managed Care – PPO | Source: Ambulatory Visit | Attending: Family Medicine | Admitting: Family Medicine

## 2015-01-03 DIAGNOSIS — Z1231 Encounter for screening mammogram for malignant neoplasm of breast: Secondary | ICD-10-CM | POA: Insufficient documentation

## 2015-06-21 IMAGING — RF DG UGI W/ KUB
15 of 16 series · 15 of 16 positions shown · non-contrast
Comparison: None.

CLINICAL DATA: Morbid obesity. Pre-op evaluation for bariatric
surgery.

EXAM:
UPPER GI SERIES WITH KUB
TECHNIQUE: After obtaining a scout radiograph a routine upper GI series was
performed using thin barium.
FLUOROSCOPY TIME:  1 min 6 seconds

[Series 1: run · 1 of 1 slices shown (1 of 13)]
[im 1/1]
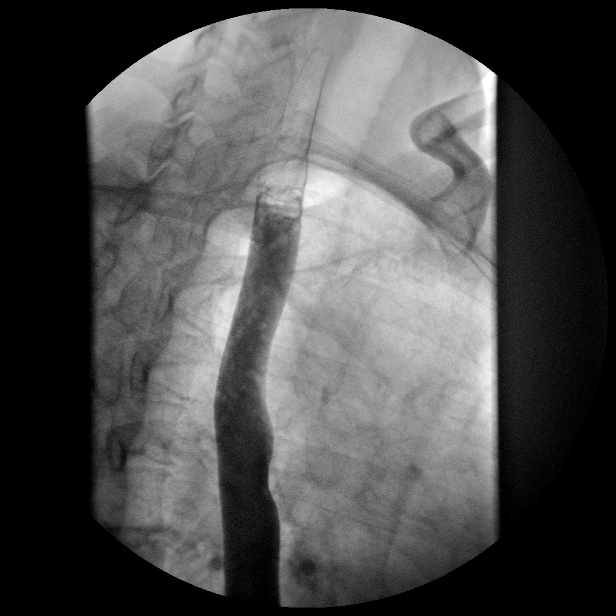

[Series 2: run · 1 of 1 slices shown (2 of 13)]
[im 1/1]
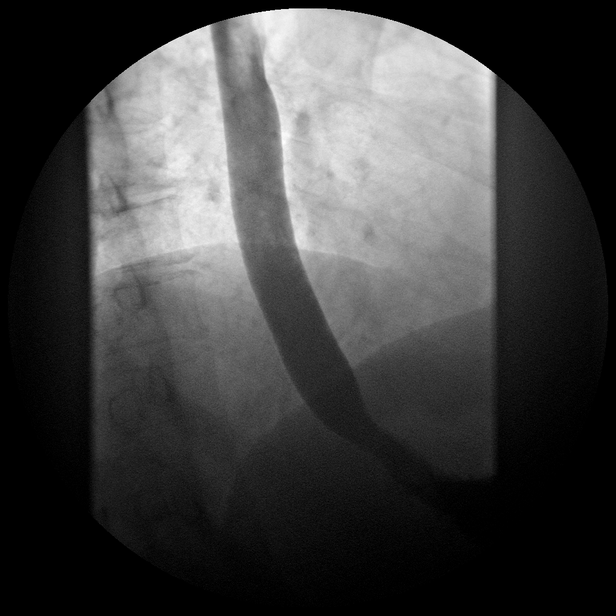

[Series 3: run · 1 of 1 slices shown (3 of 13)]
[im 1/1]
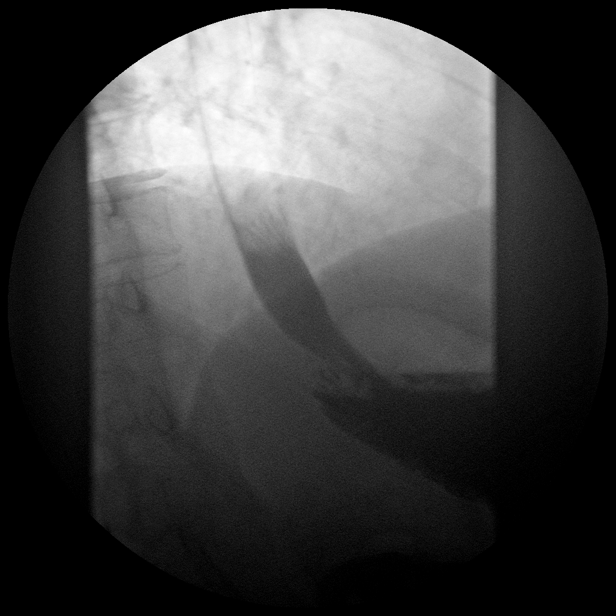

[Series 4: run · 1 of 1 slices shown (4 of 13)]
[im 1/1]
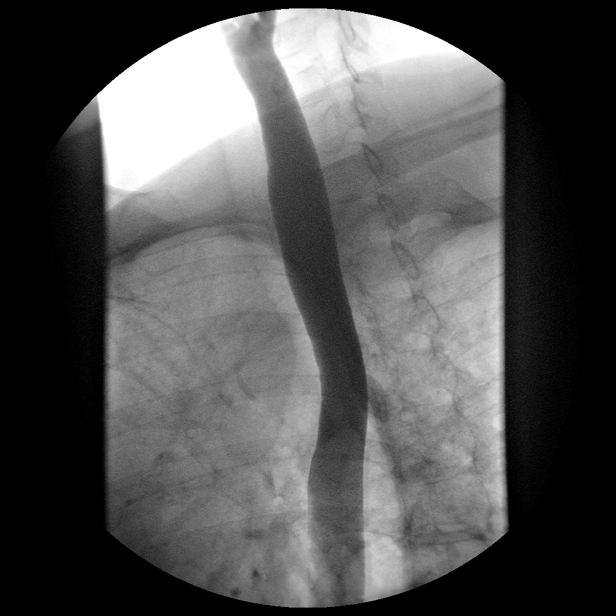

[Series 5: run · 1 of 1 slices shown (5 of 13)]
[im 1/1]
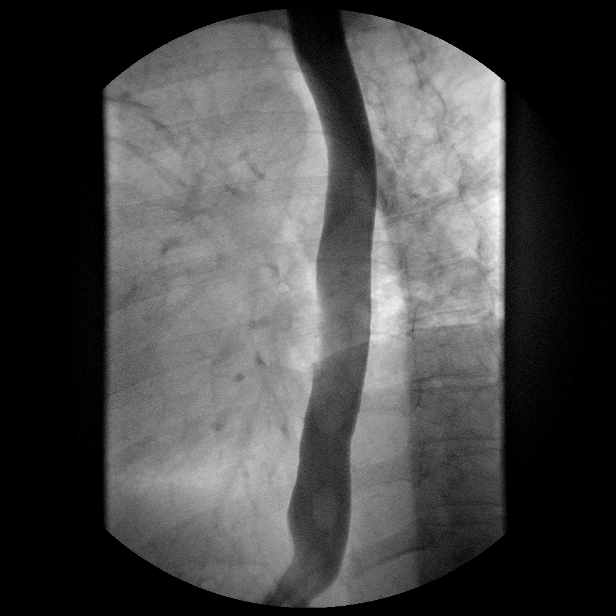

[Series 6: run · 1 of 1 slices shown (6 of 13)]
[im 1/1]
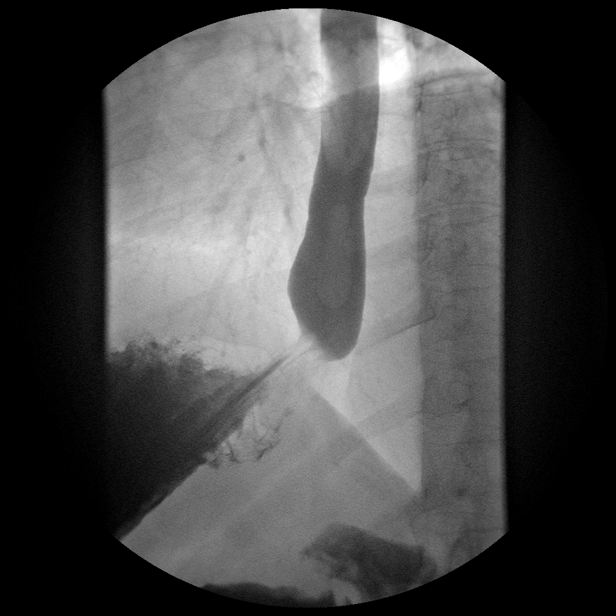

[Series 7: run · 1 of 1 slices shown (7 of 13)]
[im 1/1]
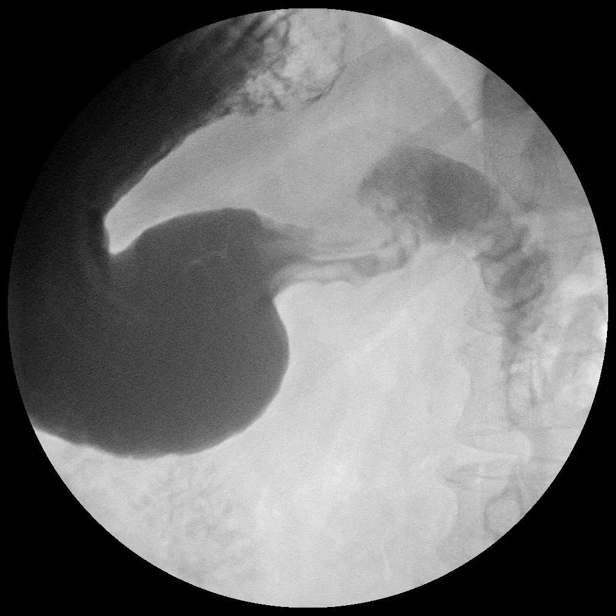

[Series 9: run · 1 of 1 slices shown (8 of 13)]
[im 1/1]
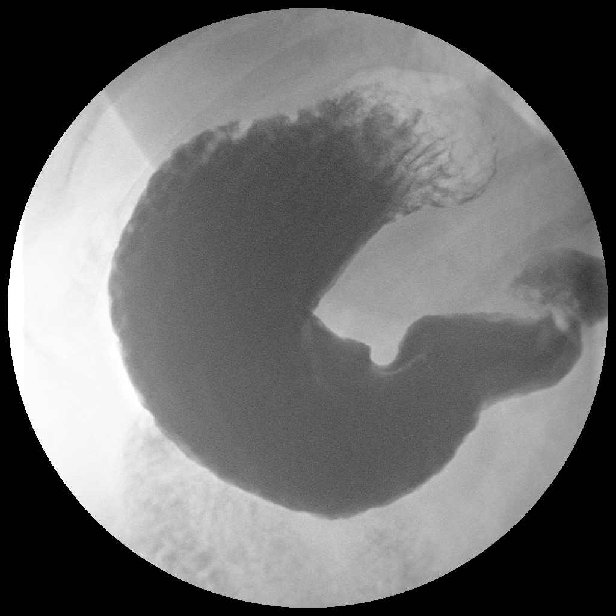

[Series 10: run · 1 of 1 slices shown (9 of 13)]
[im 1/1]
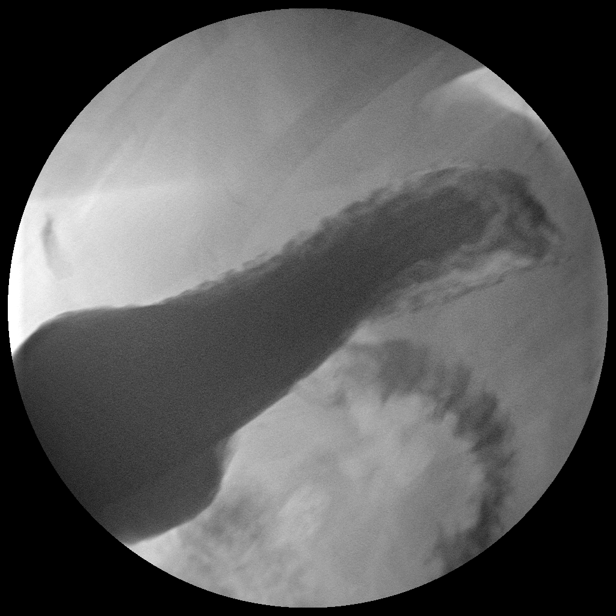

[Series 11: run · 1 of 1 slices shown (10 of 13)]
[im 1/1]
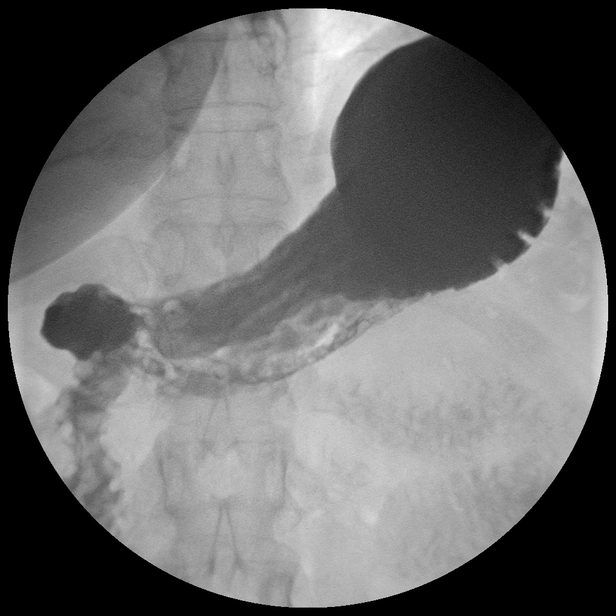

[Series 12: run · 1 of 1 slices shown (11 of 13)]
[im 1/1]
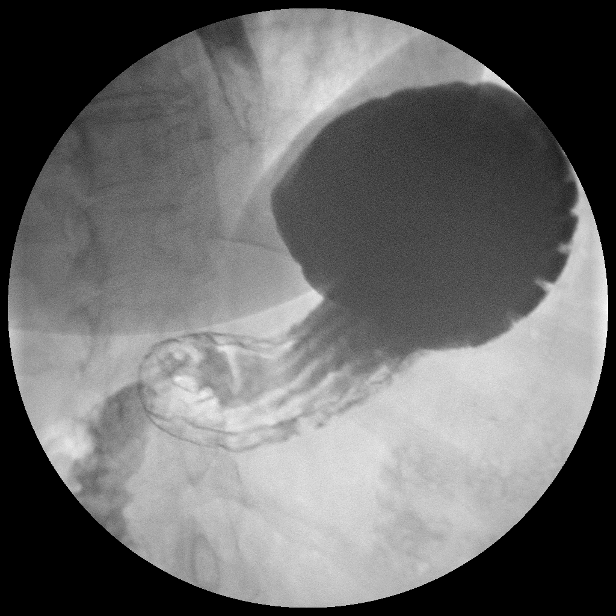

[Series 13: run · 1 of 1 slices shown (12 of 13)]
[im 1/1]
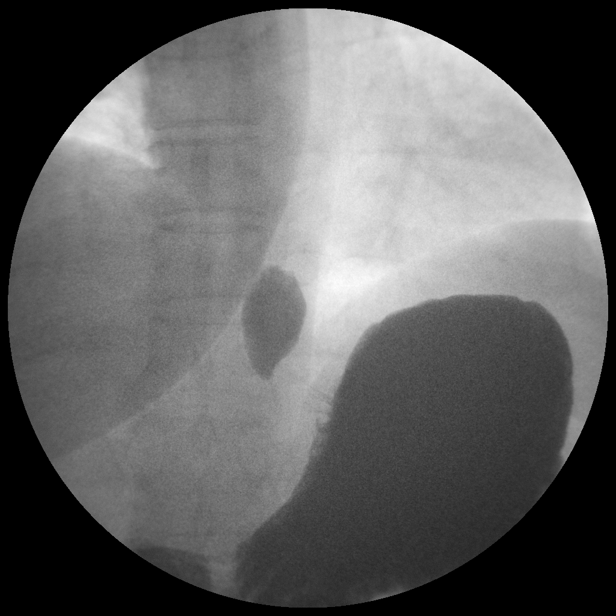

[Series 14: run · 1 of 1 slices shown (13 of 13)]
[im 1/1]
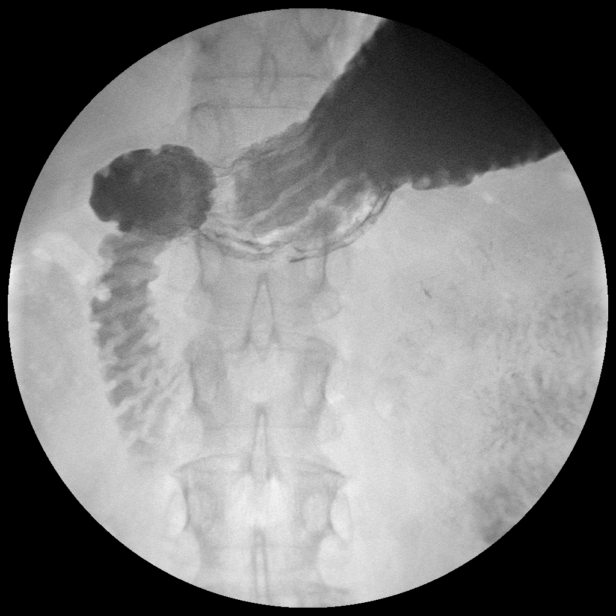

[Series 1001: view not recorded · 0.20mm/px · 1 of 1 slices shown (1 of 2)]
[im 1/1]
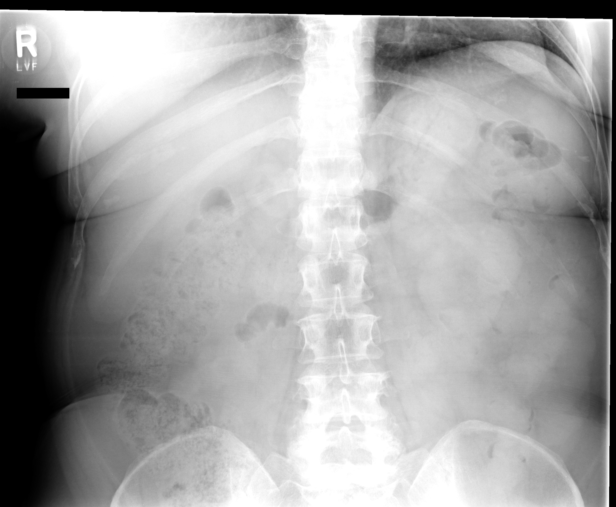

[Series 1002: view not recorded · 0.20mm/px · 1 of 1 slices shown (2 of 2)]
[im 1/1]
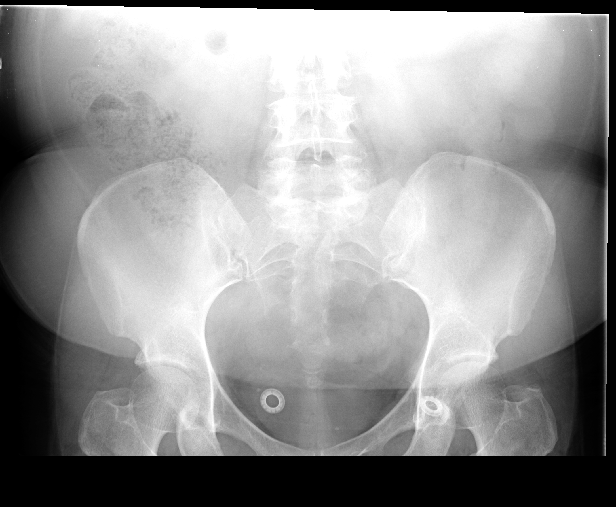

[15 of 16 positions shown; findings below may reference images not displayed]

FINDINGS: The scout radiograph shows a normal bowel gas pattern.

There is no evidence of esophageal mass or stricture. There is no
evidence of hiatal hernia. Mild gastroesophageal reflux was seen to
the level of the distal thoracic esophagus. Esophageal motility is
within normal limits.

The stomach is normal in appearance. There is no evidence of gastric
masses or ulcers. Duodenal bulb and sweep are normal in appearance.
IMPRESSION: Mild gastroesophageal reflux. No evidence of hiatal hernia,
esophageal stricture, or other significant abnormality.

## 2016-02-20 ENCOUNTER — Encounter (HOSPITAL_COMMUNITY): Payer: Self-pay

## 2016-09-14 DIAGNOSIS — C801 Malignant (primary) neoplasm, unspecified: Secondary | ICD-10-CM

## 2016-09-14 HISTORY — PX: MASTECTOMY: SHX3

## 2016-09-14 HISTORY — DX: Malignant (primary) neoplasm, unspecified: C80.1

## 2016-11-08 DIAGNOSIS — D0512 Intraductal carcinoma in situ of left breast: Secondary | ICD-10-CM | POA: Diagnosis not present

## 2016-11-08 DIAGNOSIS — Z9012 Acquired absence of left breast and nipple: Secondary | ICD-10-CM | POA: Diagnosis not present

## 2016-11-08 DIAGNOSIS — Z170 Estrogen receptor positive status [ER+]: Secondary | ICD-10-CM | POA: Diagnosis not present

## 2016-11-24 ENCOUNTER — Encounter (HOSPITAL_BASED_OUTPATIENT_CLINIC_OR_DEPARTMENT_OTHER): Payer: Self-pay | Admitting: *Deleted

## 2016-11-29 ENCOUNTER — Ambulatory Visit: Payer: Self-pay | Admitting: Plastic Surgery

## 2016-11-29 DIAGNOSIS — C50912 Malignant neoplasm of unspecified site of left female breast: Secondary | ICD-10-CM

## 2016-11-29 DIAGNOSIS — Z9012 Acquired absence of left breast and nipple: Secondary | ICD-10-CM

## 2016-11-29 NOTE — H&P (Signed)
Nancy Perry is an 56 y.o. female.   Chief Complaint: left breast cancer HPI: Nancy Perry is a 56 yo is here for a pre operative history and physical prior to left breast reconstruction with tissue expander and dermal matrix. She went for a routine mammogram and was found to have left breast asymmetry with calcifications.  The biopsy showed ductal carcinoma in situ and she underwent a mastectomy in May 2018 by Dr. Noberto Retort. Two lymph nodes were removed and both negative.  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  She is 5 feet 3 inches tall, weight is 150 pounds after 100 pounds weight loss with gastric bypass surgery in 2015.  Preop bra + 36 F. She did not receive any radiation.  She is interested in reconstruction and symmetry surgery.  She is vaping but reports the vape she uses now has no nicotine.  She has mild excess skin at the mastectomy site with some irregularity in the scarring and contracture on the left breast.  No masses or lymphadenopathy noted.  Past Medical History:  Diagnosis Date  . Back pain, chronic   . Cancer ([place]) 09/2016   left breast cancer  . Depression   . Fatty liver    "states elevated liver enzymes"  . History of Roux-en-Y gastric bypass    pt has lost 100 lbs  . Hypertension    had gastric bypass, lost 100 lbs, no meds  . OSA on CPAP    lost 100 lbs, does not use CPAP anymore  . Rotator cuff tear    work related accident (Left shoulder)-01-11-14 limited ROM    Past Surgical History:  Procedure Laterality Date  . ABDOMINAL HYSTERECTOMY  1998  . CHOLECYSTECTOMY     Laparoscopic"gallstones" 11-28-13 [facility]   . GASTRIC ROUX-EN-Y N/A 01/15/2014   Procedure: LAPAROSCOPIC ROUX-EN-Y GASTRIC BYPASS WITH UPPER ENDOSCOPY;  Surgeon: Kaylyn Lim, MD;  Location: [facility];  Service: General;  Laterality: N/A;  . MASTECTOMY Left 09/2016   at [facility]    No family history on  file. Social History:  reports that she quit smoking about 4 years ago. She has never used smokeless tobacco. She reports that she does not drink alcohol or use drugs.  Allergies:  Allergies  Allergen Reactions  . Morphine And Related Swelling  . Ivp Dye [Iodinated Diagnostic Agents] Swelling    Facial swelling and rash  . Oxycodone Nausea Only    Itching, OK to take Vicodin     (Not in a hospital admission)  No results found for this or any previous visit (from the past 48 hour(s)). No results found.  Review of Systems  Constitutional: Negative.   HENT: Negative.   Eyes: Negative.   Respiratory: Negative.   Cardiovascular: Negative.   Gastrointestinal: Negative.   Genitourinary: Negative.   Musculoskeletal: Negative.   Skin: Negative.     There were no vitals taken for this visit. Physical Exam  Constitutional: She is oriented to person, place, and time. She appears well-developed and well-nourished.  HENT:  Head: Normocephalic and atraumatic.  Eyes: Pupils are equal, round, and reactive to light. EOM are normal.  Cardiovascular: Normal rate.   Respiratory: Effort normal. No respiratory distress.  GI: Soft. She exhibits no distension.  Musculoskeletal: Normal range of motion.  Neurological: She is alert and oriented to person, place, and time.  Skin: Skin is warm. No rash noted. No erythema. No pallor.  Psychiatric: She has a normal mood and affect. Her behavior is normal. Judgment and thought content normal.     Assessment/Plan Left breast reconstruction with tissue expander and dermal matrix.  The risks that can be encountered with and after placement of a breast expander placement were discussed and include the following but not limited to these: bleeding, infection, delayed healing, anesthesia risks, skin sensation changes, injury to structures including nerves, blood vessels, and muscles which may be temporary or permanent, allergies to tape, suture materials  and glues, blood products, topical preparations or injected agents, skin contour irregularities, skin discoloration and swelling, deep vein thrombosis, cardiac and pulmonary complications, pain, which may persist, fluid accumulation, wrinkling of the skin over the expander, changes in nipple or breast sensation, expander leakage or rupture, faulty position of the expander, persistent pain, formation of tight scar tissue around the expander (capsular contracture), possible need for revisional surgery or staged procedures. The patient's questions were answered and she desires to proceed and consent was obtained.  Norco, Valium and Keflex prescribed at this visit.   Wallace Going, DO 11/29/2016, 7:38 AM

## 2016-12-01 ENCOUNTER — Ambulatory Visit (HOSPITAL_BASED_OUTPATIENT_CLINIC_OR_DEPARTMENT_OTHER): Payer: 59 | Admitting: Anesthesiology

## 2016-12-01 ENCOUNTER — Encounter (HOSPITAL_BASED_OUTPATIENT_CLINIC_OR_DEPARTMENT_OTHER): Payer: Self-pay | Admitting: Plastic Surgery

## 2016-12-01 ENCOUNTER — Encounter (HOSPITAL_BASED_OUTPATIENT_CLINIC_OR_DEPARTMENT_OTHER)
Admission: RE | Disposition: A | Discharge status: Home / Self Care | Payer: Self-pay | Source: Ambulatory Visit | Attending: Plastic Surgery

## 2016-12-01 ENCOUNTER — Ambulatory Visit (HOSPITAL_BASED_OUTPATIENT_CLINIC_OR_DEPARTMENT_OTHER)
Admission: RE | Admit: 2016-12-01 | Discharge: 2016-12-01 | Disposition: A | Discharge status: Home / Self Care | Payer: 59 | Source: Ambulatory Visit | Attending: Plastic Surgery | Admitting: Plastic Surgery

## 2016-12-01 DIAGNOSIS — Z87891 Personal history of nicotine dependence: Secondary | ICD-10-CM | POA: Diagnosis not present

## 2016-12-01 DIAGNOSIS — G4733 Obstructive sleep apnea (adult) (pediatric): Secondary | ICD-10-CM | POA: Diagnosis not present

## 2016-12-01 DIAGNOSIS — I10 Essential (primary) hypertension: Secondary | ICD-10-CM | POA: Insufficient documentation

## 2016-12-01 DIAGNOSIS — Z9012 Acquired absence of left breast and nipple: Secondary | ICD-10-CM

## 2016-12-01 DIAGNOSIS — Z9884 Bariatric surgery status: Secondary | ICD-10-CM | POA: Diagnosis not present

## 2016-12-01 DIAGNOSIS — L905 Scar conditions and fibrosis of skin: Secondary | ICD-10-CM | POA: Diagnosis not present

## 2016-12-01 DIAGNOSIS — Z421 Encounter for breast reconstruction following mastectomy: Secondary | ICD-10-CM | POA: Diagnosis not present

## 2016-12-01 DIAGNOSIS — Z853 Personal history of malignant neoplasm of breast: Secondary | ICD-10-CM | POA: Insufficient documentation

## 2016-12-01 DIAGNOSIS — C50912 Malignant neoplasm of unspecified site of left female breast: Secondary | ICD-10-CM

## 2016-12-01 HISTORY — DX: Bariatric surgery status: Z98.84

## 2016-12-01 HISTORY — PX: BREAST RECONSTRUCTION WITH PLACEMENT OF TISSUE EXPANDER AND FLEX HD (ACELLULAR HYDRATED DERMIS): SHX6295

## 2016-12-01 HISTORY — DX: Malignant (primary) neoplasm, unspecified: C80.1

## 2016-12-01 SURGERY — BREAST RECONSTRUCTION WITH PLACEMENT OF TISSUE EXPANDER AND FLEX HD (ACELLULAR HYDRATED DERMIS)
Anesthesia: General | Site: Breast | Laterality: Left

## 2016-12-01 MED ORDER — FENTANYL CITRATE (PF) 100 MCG/2ML IJ SOLN
25.0000 ug | INTRAMUSCULAR | Status: DC | PRN
  Administered 2016-12-01 (×3): 25 ug via INTRAVENOUS
  Administered 2016-12-01: 50 ug via INTRAVENOUS
  Administered 2016-12-01: 25 ug via INTRAVENOUS

## 2016-12-01 MED ORDER — METOCLOPRAMIDE HCL 5 MG/ML IJ SOLN: 10.0000 mg | Freq: Once | INTRAMUSCULAR | Status: DC | PRN

## 2016-12-01 MED ORDER — GLYCOPYRROLATE 0.2 MG/ML IV SOSY
PREFILLED_SYRINGE | INTRAVENOUS | Status: DC | PRN
  Administered 2016-12-01: .2 mg via INTRAVENOUS

## 2016-12-01 MED ORDER — CHLORHEXIDINE GLUCONATE CLOTH 2 % EX PADS: 6.0000 | MEDICATED_PAD | Freq: Once | CUTANEOUS | Status: DC

## 2016-12-01 MED ORDER — ONDANSETRON HCL 4 MG/2ML IJ SOLN
INTRAMUSCULAR | Status: AC
  Filled 2016-12-01: qty 2

## 2016-12-01 MED ORDER — MIDAZOLAM HCL 2 MG/2ML IJ SOLN
INTRAMUSCULAR | Status: AC
  Filled 2016-12-01: qty 2

## 2016-12-01 MED ORDER — EPHEDRINE SULFATE-NACL 50-0.9 MG/10ML-% IV SOSY
PREFILLED_SYRINGE | INTRAVENOUS | Status: DC | PRN
  Administered 2016-12-01: 10 mg via INTRAVENOUS

## 2016-12-01 MED ORDER — PROPOFOL 10 MG/ML IV BOLUS
INTRAVENOUS | Status: DC | PRN
  Administered 2016-12-01: 200 mg via INTRAVENOUS

## 2016-12-01 MED ORDER — MEPERIDINE HCL 25 MG/ML IJ SOLN: 6.2500 mg | INTRAMUSCULAR | Status: DC | PRN

## 2016-12-01 MED ORDER — FENTANYL CITRATE (PF) 100 MCG/2ML IJ SOLN
INTRAMUSCULAR | Status: AC
  Filled 2016-12-01: qty 2

## 2016-12-01 MED ORDER — FENTANYL CITRATE (PF) 100 MCG/2ML IJ SOLN
50.0000 ug | INTRAMUSCULAR | Status: DC | PRN
  Administered 2016-12-01: 100 ug via INTRAVENOUS

## 2016-12-01 MED ORDER — HYDROCODONE-ACETAMINOPHEN 5-325 MG PO TABS
1.0000 | ORAL_TABLET | Freq: Once | ORAL | Status: AC
  Administered 2016-12-01: 1 via ORAL

## 2016-12-01 MED ORDER — EPHEDRINE 5 MG/ML INJ
INTRAVENOUS | Status: AC
  Filled 2016-12-01: qty 10

## 2016-12-01 MED ORDER — BUPIVACAINE-EPINEPHRINE 0.25% -1:200000 IJ SOLN
INTRAMUSCULAR | Status: DC | PRN
  Administered 2016-12-01: 7 mL

## 2016-12-01 MED ORDER — CEFAZOLIN SODIUM-DEXTROSE 2-4 GM/100ML-% IV SOLN
2.0000 g | INTRAVENOUS | Status: AC
  Administered 2016-12-01: 2 g via INTRAVENOUS

## 2016-12-01 MED ORDER — KETOROLAC TROMETHAMINE 30 MG/ML IJ SOLN
INTRAMUSCULAR | Status: AC
  Filled 2016-12-01: qty 1

## 2016-12-01 MED ORDER — SODIUM CHLORIDE 0.9% FLUSH: 3.0000 mL | INTRAVENOUS | Status: DC | PRN

## 2016-12-01 MED ORDER — SUCCINYLCHOLINE CHLORIDE 200 MG/10ML IV SOSY
PREFILLED_SYRINGE | INTRAVENOUS | Status: AC
  Filled 2016-12-01: qty 10

## 2016-12-01 MED ORDER — LACTATED RINGERS IV SOLN
INTRAVENOUS | Status: DC
  Administered 2016-12-01 (×2): via INTRAVENOUS

## 2016-12-01 MED ORDER — SODIUM CHLORIDE 0.9 % IV SOLN
250.0000 mL | INTRAVENOUS | Status: DC | PRN
Start: 2016-12-01 — End: 2016-12-01

## 2016-12-01 MED ORDER — SCOPOLAMINE 1 MG/3DAYS TD PT72: 1.0000 | MEDICATED_PATCH | Freq: Once | TRANSDERMAL | Status: DC | PRN

## 2016-12-01 MED ORDER — PROPOFOL 500 MG/50ML IV EMUL
INTRAVENOUS | Status: AC
  Filled 2016-12-01: qty 50

## 2016-12-01 MED ORDER — HYDROCODONE-ACETAMINOPHEN 5-325 MG PO TABS
ORAL_TABLET | ORAL | Status: AC
  Filled 2016-12-01: qty 1

## 2016-12-01 MED ORDER — KETOROLAC TROMETHAMINE 30 MG/ML IJ SOLN
30.0000 mg | Freq: Once | INTRAMUSCULAR | Status: AC
  Administered 2016-12-01: 30 mg via INTRAVENOUS

## 2016-12-01 MED ORDER — DEXAMETHASONE SODIUM PHOSPHATE 10 MG/ML IJ SOLN
INTRAMUSCULAR | Status: AC
  Filled 2016-12-01: qty 1

## 2016-12-01 MED ORDER — ACETAMINOPHEN 650 MG RE SUPP: 650.0000 mg | RECTAL | Status: DC | PRN

## 2016-12-01 MED ORDER — LIDOCAINE HCL (CARDIAC) 20 MG/ML IV SOLN
INTRAVENOUS | Status: AC
Start: 2016-12-01 — End: 2016-12-01
  Filled 2016-12-01: qty 5

## 2016-12-01 MED ORDER — ACETAMINOPHEN 325 MG PO TABS: 650.0000 mg | ORAL_TABLET | ORAL | Status: DC | PRN

## 2016-12-01 MED ORDER — LIDOCAINE 2% (20 MG/ML) 5 ML SYRINGE
INTRAMUSCULAR | Status: DC | PRN
  Administered 2016-12-01: 60 mg via INTRAVENOUS

## 2016-12-01 MED ORDER — BUPIVACAINE-EPINEPHRINE (PF) 0.25% -1:200000 IJ SOLN
INTRAMUSCULAR | Status: AC
  Filled 2016-12-01: qty 30

## 2016-12-01 MED ORDER — MIDAZOLAM HCL 2 MG/2ML IJ SOLN
1.0000 mg | INTRAMUSCULAR | Status: DC | PRN
  Administered 2016-12-01: 2 mg via INTRAVENOUS

## 2016-12-01 MED ORDER — SODIUM CHLORIDE 0.9% FLUSH: 3.0000 mL | Freq: Two times a day (BID) | INTRAVENOUS | Status: DC

## 2016-12-01 MED ORDER — SODIUM CHLORIDE 0.9 % IV SOLN
INTRAVENOUS | Status: DC | PRN
  Administered 2016-12-01: 500 mL

## 2016-12-01 MED ORDER — LACTATED RINGERS IV SOLN: INTRAVENOUS | Status: DC

## 2016-12-01 MED ORDER — DEXAMETHASONE SODIUM PHOSPHATE 4 MG/ML IJ SOLN
INTRAMUSCULAR | Status: DC | PRN
  Administered 2016-12-01: 10 mg via INTRAVENOUS

## 2016-12-01 MED ORDER — CEFAZOLIN SODIUM-DEXTROSE 2-4 GM/100ML-% IV SOLN
INTRAVENOUS | Status: AC
Start: 2016-12-01 — End: 2016-12-01
  Filled 2016-12-01: qty 100

## 2016-12-01 SURGICAL SUPPLY — 63 items
BAG DECANTER FOR FLEXI CONT (MISCELLANEOUS) ×3 IMPLANT
BINDER BREAST LRG (GAUZE/BANDAGES/DRESSINGS) ×3 IMPLANT
BINDER BREAST MEDIUM (GAUZE/BANDAGES/DRESSINGS) IMPLANT
BINDER BREAST XLRG (GAUZE/BANDAGES/DRESSINGS) IMPLANT
BINDER BREAST XXLRG (GAUZE/BANDAGES/DRESSINGS) IMPLANT
BIOPATCH RED 1 DISK 7.0 (GAUZE/BANDAGES/DRESSINGS) ×2 IMPLANT
BIOPATCH RED 1IN DISK 7.0MM (GAUZE/BANDAGES/DRESSINGS) ×1
BLADE HEX COATED 2.75 (ELECTRODE) ×3 IMPLANT
BLADE SURG 15 STRL LF DISP TIS (BLADE) ×2 IMPLANT
BLADE SURG 15 STRL SS (BLADE) ×4
BNDG GAUZE ELAST 4 BULKY (GAUZE/BANDAGES/DRESSINGS) IMPLANT
CANISTER SUCT 1200ML W/VALVE (MISCELLANEOUS) ×3 IMPLANT
CHLORAPREP W/TINT 26ML (MISCELLANEOUS) ×3 IMPLANT
COVER BACK TABLE 60X90IN (DRAPES) ×3 IMPLANT
COVER MAYO STAND STRL (DRAPES) ×3 IMPLANT
DECANTER SPIKE VIAL GLASS SM (MISCELLANEOUS) IMPLANT
DERMABOND ADVANCED (GAUZE/BANDAGES/DRESSINGS) ×2
DERMABOND ADVANCED .7 DNX12 (GAUZE/BANDAGES/DRESSINGS) ×1 IMPLANT
DRAIN CHANNEL 19F RND (DRAIN) ×3 IMPLANT
DRAPE LAPAROSCOPIC ABDOMINAL (DRAPES) ×3 IMPLANT
DRSG PAD ABDOMINAL 8X10 ST (GAUZE/BANDAGES/DRESSINGS) ×6 IMPLANT
ELECT BLADE 4.0 EZ CLEAN MEGAD (MISCELLANEOUS) ×3
ELECT REM PT RETURN 9FT ADLT (ELECTROSURGICAL) ×3
ELECTRODE BLDE 4.0 EZ CLN MEGD (MISCELLANEOUS) ×1 IMPLANT
ELECTRODE REM PT RTRN 9FT ADLT (ELECTROSURGICAL) ×1 IMPLANT
EVACUATOR SILICONE 100CC (DRAIN) ×3 IMPLANT
GLOVE BIO SURGEON STRL SZ 6.5 (GLOVE) ×8 IMPLANT
GLOVE BIO SURGEON STRL SZ7 (GLOVE) ×3 IMPLANT
GLOVE BIO SURGEONS STRL SZ 6.5 (GLOVE) ×4
GOWN STRL REUS W/ TWL LRG LVL3 (GOWN DISPOSABLE) ×3 IMPLANT
GOWN STRL REUS W/TWL LRG LVL3 (GOWN DISPOSABLE) ×6
GRAFT FLEX HD 4X16 THICK (Tissue Mesh) ×3 IMPLANT
IMPL BREAST 300CC (Breast) ×1 IMPLANT
IMPLANT BREAST 300CC (Breast) ×3 IMPLANT
IV NS 1000ML (IV SOLUTION)
IV NS 1000ML BAXH (IV SOLUTION) IMPLANT
IV NS 500ML (IV SOLUTION) ×2
IV NS 500ML BAXH (IV SOLUTION) ×1 IMPLANT
KIT FILL SYSTEM UNIVERSAL (SET/KITS/TRAYS/PACK) ×3 IMPLANT
NEEDLE HYPO 25X1 1.5 SAFETY (NEEDLE) ×3 IMPLANT
PACK BASIN DAY SURGERY FS (CUSTOM PROCEDURE TRAY) ×3 IMPLANT
PENCIL BUTTON HOLSTER BLD 10FT (ELECTRODE) ×3 IMPLANT
PIN SAFETY STERILE (MISCELLANEOUS) ×3 IMPLANT
SET ASEPTIC TRANSFER (MISCELLANEOUS) ×3 IMPLANT
SLEEVE SCD COMPRESS KNEE MED (MISCELLANEOUS) ×3 IMPLANT
SPONGE LAP 18X18 X RAY DECT (DISPOSABLE) ×6 IMPLANT
SUT MNCRL AB 4-0 PS2 18 (SUTURE) ×6 IMPLANT
SUT MON AB 3-0 SH 27 (SUTURE) ×4
SUT MON AB 3-0 SH27 (SUTURE) ×2 IMPLANT
SUT MON AB 5-0 PS2 18 (SUTURE) ×6 IMPLANT
SUT PDS 3-0 CT2 (SUTURE)
SUT PDS AB 2-0 CT2 27 (SUTURE) ×9 IMPLANT
SUT PDS II 3-0 CT2 27 ABS (SUTURE) IMPLANT
SUT SILK 3 0 PS 1 (SUTURE) ×3 IMPLANT
SUT VIC AB 3-0 SH 27 (SUTURE)
SUT VIC AB 3-0 SH 27X BRD (SUTURE) IMPLANT
SYR BULB IRRIGATION 50ML (SYRINGE) ×3 IMPLANT
SYR CONTROL 10ML LL (SYRINGE) ×3 IMPLANT
TOWEL OR 17X24 6PK STRL BLUE (TOWEL DISPOSABLE) ×3 IMPLANT
TUBE CONNECTING 20'X1/4 (TUBING) ×1
TUBE CONNECTING 20X1/4 (TUBING) ×2 IMPLANT
UNDERPAD 30X30 (UNDERPADS AND DIAPERS) ×3 IMPLANT
YANKAUER SUCT BULB TIP NO VENT (SUCTIONS) ×3 IMPLANT

## 2016-12-01 NOTE — Interval H&P Note (Signed)
History and Physical Interval Note:  12/01/2016 7:55 AM  Nancy Perry  has presented today for surgery, with the diagnosis of malignant neoplasm of left breast  The various methods of treatment have been discussed with the patient and family. After consideration of risks, benefits and other options for treatment, the patient has consented to  Procedure(s): LEFT BREAST RECONSTRUCTION WITH PLACEMENT OF TISSUE EXPANDER AND FLEX HD (ACELLULAR HYDRATED DERMIS) (Left) as a surgical intervention .  The patient's history has been reviewed, patient examined, no change in status, stable for surgery.  I have reviewed the patient's chart and labs.  Questions were answered to the patient's satisfaction.     Wallace Going

## 2016-12-01 NOTE — Anesthesia Procedure Notes (Signed)
Procedure Name: LMA Insertion Date/Time: 12/01/2016 8:49 AM Performed by: Lieutenant Diego Pre-anesthesia Checklist: Patient identified, Emergency Drugs available, Suction available and Patient being monitored Patient Re-evaluated:Patient Re-evaluated prior to induction Oxygen Delivery Method: Circle system utilized Preoxygenation: Pre-oxygenation with 100% oxygen Induction Type: IV induction Ventilation: Mask ventilation without difficulty LMA: LMA inserted LMA Size: 4.0 Number of attempts: 1 Airway Equipment and Method: Bite block Placement Confirmation: positive ETCO2 and breath sounds checked- equal and bilateral Tube secured with: Tape Dental Injury: Teeth and Oropharynx as per pre-operative assessment

## 2016-12-01 NOTE — H&P (View-Only) (Signed)
Nancy Perry is an 56 y.o. female.   Chief Complaint: left breast cancer HPI: Nancy Perry is a 56 yo is here for a pre operative history and physical prior to left breast reconstruction with tissue expander and dermal matrix. She went for a routine mammogram and was found to have left breast asymmetry with calcifications.  The biopsy showed ductal carcinoma in situ and she underwent a mastectomy in May 2018 by Dr. Noberto Retort. Two lymph nodes were removed and both negative.  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  She is 5 feet 3 inches tall, weight is 150 pounds after 100 pounds weight loss with gastric bypass surgery in 2015.  Preop bra + 36 F. She did not receive any radiation.  She is interested in reconstruction and symmetry surgery.  She is vaping but reports the vape she uses now has no nicotine.  She has mild excess skin at the mastectomy site with some irregularity in the scarring and contracture on the left breast.  No masses or lymphadenopathy noted.  Past Medical History:  Diagnosis Date  . Back pain, chronic   . Cancer ([place]) 09/2016   left breast cancer  . Depression   . Fatty liver    "states elevated liver enzymes"  . History of Roux-en-Y gastric bypass    pt has lost 100 lbs  . Hypertension    had gastric bypass, lost 100 lbs, no meds  . OSA on CPAP    lost 100 lbs, does not use CPAP anymore  . Rotator cuff tear    work related accident (Left shoulder)-01-11-14 limited ROM    Past Surgical History:  Procedure Laterality Date  . ABDOMINAL HYSTERECTOMY  1998  . CHOLECYSTECTOMY     Laparoscopic"gallstones" 11-28-13 [facility]   . GASTRIC ROUX-EN-Y N/A 01/15/2014   Procedure: LAPAROSCOPIC ROUX-EN-Y GASTRIC BYPASS WITH UPPER ENDOSCOPY;  Surgeon: Kaylyn Lim, MD;  Location: [facility];  Service: General;  Laterality: N/A;  . MASTECTOMY Left 09/2016   at [place]    No family history on  file. Social History:  reports that she quit smoking about 4 years ago. She has never used smokeless tobacco. She reports that she does not drink alcohol or use drugs.  Allergies:  Allergies  Allergen Reactions  . Morphine And Related Swelling  . Ivp Dye [Iodinated Diagnostic Agents] Swelling    Facial swelling and rash  . Oxycodone Nausea Only    Itching, OK to take Vicodin     (Not in a hospital admission)  No results found for this or any previous visit (from the past 48 hour(s)). No results found.  Review of Systems  Constitutional: Negative.   HENT: Negative.   Eyes: Negative.   Respiratory: Negative.   Cardiovascular: Negative.   Gastrointestinal: Negative.   Genitourinary: Negative.   Musculoskeletal: Negative.   Skin: Negative.     There were no vitals taken for this visit. Physical Exam  Constitutional: She is oriented to person, place, and time. She appears well-developed and well-nourished.  HENT:  Head: Normocephalic and atraumatic.  Eyes: Pupils are equal, round, and reactive to light. EOM are normal.  Cardiovascular: Normal rate.   Respiratory: Effort normal. No respiratory distress.  GI: Soft. She exhibits no distension.  Musculoskeletal: Normal range of motion.  Neurological: She is alert and oriented to person, place, and time.  Skin: Skin is warm. No rash noted. No erythema. No pallor.  Psychiatric: She has a normal mood and affect. Her behavior is normal. Judgment and thought content normal.     Assessment/Plan Left breast reconstruction with tissue expander and dermal matrix.  The risks that can be encountered with and after placement of a breast expander placement were discussed and include the following but not limited to these: bleeding, infection, delayed healing, anesthesia risks, skin sensation changes, injury to structures including nerves, blood vessels, and muscles which may be temporary or permanent, allergies to tape, suture materials  and glues, blood products, topical preparations or injected agents, skin contour irregularities, skin discoloration and swelling, deep vein thrombosis, cardiac and pulmonary complications, pain, which may persist, fluid accumulation, wrinkling of the skin over the expander, changes in nipple or breast sensation, expander leakage or rupture, faulty position of the expander, persistent pain, formation of tight scar tissue around the expander (capsular contracture), possible need for revisional surgery or staged procedures. The patient's questions were answered and she desires to proceed and consent was obtained.  Norco, Valium and Keflex prescribed at this visit.   Wallace Going, DO 11/29/2016, 7:38 AM

## 2016-12-01 NOTE — Anesthesia Postprocedure Evaluation (Signed)
Anesthesia Post Note  Patient: Davanna A. Zundel  Procedure(s) Performed: Procedure(s) (LRB): LEFT BREAST RECONSTRUCTION WITH PLACEMENT OF TISSUE EXPANDER AND FLEX HD (ACELLULAR HYDRATED DERMIS) (Left)     Patient location during evaluation: PACU Anesthesia Type: General Level of consciousness: awake and alert Pain management: pain level controlled Vital Signs Assessment: post-procedure vital signs reviewed and stable Respiratory status: spontaneous breathing, nonlabored ventilation, respiratory function stable and patient connected to nasal cannula oxygen Cardiovascular status: blood pressure returned to baseline and stable Postop Assessment: no signs of nausea or vomiting Anesthetic complications: no    Last Vitals:  Vitals:   12/01/16 1130 12/01/16 1145  BP: (!) 152/75 (!) 143/76  Pulse: 88   Resp: 11 18  Temp:  36.9 C    Last Pain:  Vitals:   12/01/16 1145  TempSrc:   PainSc: 4                  Montez Hageman

## 2016-12-01 NOTE — Anesthesia Preprocedure Evaluation (Signed)
Anesthesia Evaluation  Patient identified by MRN, date of birth, ID band Patient awake    Reviewed: Allergy & Precautions, NPO status , Patient's Chart, lab work & pertinent test results  Airway Mallampati: II  TM Distance: >3 FB Neck ROM: Full    Dental no notable dental hx.    Pulmonary former smoker,    Pulmonary exam normal breath sounds clear to auscultation       Cardiovascular negative cardio ROS Normal cardiovascular exam Rhythm:Regular Rate:Normal     Neuro/Psych negative neurological ROS  negative psych ROS   GI/Hepatic negative GI ROS, Neg liver ROS,   Endo/Other  negative endocrine ROS  Renal/GU negative Renal ROS  negative genitourinary   Musculoskeletal negative musculoskeletal ROS (+)   Abdominal   Peds negative pediatric ROS (+)  Hematology negative hematology ROS (+)   Anesthesia Other Findings   Reproductive/Obstetrics negative OB ROS                             Anesthesia Physical Anesthesia Plan  ASA: II  Anesthesia Plan: General   Post-op Pain Management:    Induction: Intravenous  PONV Risk Score and Plan: 3 and Ondansetron, Dexamethasone, Propofol and Midazolam  Airway Management Planned: LMA  Additional Equipment:   Intra-op Plan:   Post-operative Plan: Extubation in OR  Informed Consent: I have reviewed the patients History and Physical, chart, labs and discussed the procedure including the risks, benefits and alternatives for the proposed anesthesia with the patient or authorized representative who has indicated his/her understanding and acceptance.   Dental advisory given  Plan Discussed with: CRNA  Anesthesia Plan Comments:         Anesthesia Quick Evaluation

## 2016-12-01 NOTE — Op Note (Signed)
Op report Unilateral Breast Exchange   DATE OF OPERATION:  12/01/2016  LOCATION: [facility]  SURGICAL DIVISION: Plastic Surgery  PREOPERATIVE DIAGNOSES:  1. History of left breast cancer.  2. Acquired absence of left breast.   POSTOPERATIVE DIAGNOSES:  1. History of left breast cancer.  2. Acquired absence of left breast.   PROCEDURE:  1. Left breast reconstruction with placement of Acellular Dermal Matrix and tissue expanders.   SURGEON: Claire Sanger Dillingham, DO  ASSISTANT: Shawn Rayburn, PA  ANESTHESIA:  General.   COMPLICATIONS: None.   IMPLANTS: Mentor Artoura High Profile smooth expander 300cc. Ref #[id number].  Serial Number [id number] with 200 cc of saline placed in the expander.  Acellular Dermal Matrix 4 x 16 cm  INDICATIONS FOR PROCEDURE:  The patient, Nancy Perry, is a 56 y.o. female born on 11-02-1960, is here for  immediate first stage breast reconstruction with placement of left tissue expander and Acellular dermal matrix. MRN: [id number]  CONSENT:  Informed consent was obtained directly from the patient. Risks, benefits and alternatives were fully discussed. Specific risks including but not limited to bleeding, infection, hematoma, seroma, scarring, pain, implant infection, implant extrusion, capsular contracture, asymmetry, wound healing problems, and need for further surgery were all discussed. The patient did have an ample opportunity to have her questions answered to her satisfaction.   DESCRIPTION OF PROCEDURE:  The patient was taken to the operating room. SCDs were placed and IV antibiotics were given. The patient's chest was prepped and draped in a sterile fashion. A time out was performed and the implants to be used were identified.  The previous mastectomy scar of the left breast was excised with a #10 blade and sent to pathology.  The bovie was used to dissect to the pectoralis muscle and free it from the soft  tissue. The pectoralis major muscle was lifted from the chest wall with release of the lateral edge and lateral inframammary fold.  The pocket was irrigated with antibiotic solution and hemostasis was achieved with electrocautery.  The ADM was then prepared according to the manufacture guidelines and slits placed to help with postoperative fluid management.  The ADM was then sutured to the inferior and lateral edge of the inframammary fold with 2-0 PDS starting with an interrupted stitch and then a running stitch.  The lateral portion was sutured to with interrupted sutures after the expander was placed.  The expander was prepared according to the manufacture guidelines, the air evacuated and then it was placed under the ADM and pectoralis major muscle.  The inferior and lateral tabs were used to secure the expander to the chest wall with 2-0 PDS.  The drain was placed at the inframammary fold over the ADM and secured to the skin with 3-0 Silk.  The drain was placed and secured to the chest with a 4-0 Silk suture.  The expander was infused with 200 cc of injectable saline.  The deep layers were closed with 3-0 Monocryl followed by 4-0 Monocryl.  The skin was closed with 5-0 Monocryl and then dermabond was applied.  The ABDs and breast binder were placed.  The patient tolerated the procedure well and there were no complications.  The patient was allowed to wake from anesthesia and taken to the recovery room in satisfactory condition.

## 2016-12-01 NOTE — Transfer of Care (Signed)
Immediate Anesthesia Transfer of Care Note  Patient: Nancy Perry  Procedure(s) Performed: Procedure(s): LEFT BREAST RECONSTRUCTION WITH PLACEMENT OF TISSUE EXPANDER AND FLEX HD (ACELLULAR HYDRATED DERMIS) (Left)  Patient Location: PACU  Anesthesia Type:General  Level of Consciousness: awake and alert   Airway & Oxygen Therapy: Patient Spontanous Breathing and Patient connected to face mask oxygen  Post-op Assessment: Report given to RN and Post -op Vital signs reviewed and stable  Post vital signs: Reviewed and stable  Last Vitals:  Vitals:   12/01/16 0719  BP: 125/67  Pulse: (!) 51  Resp: 16  Temp: 36.7 C    Last Pain:  Vitals:   12/01/16 0719  TempSrc: Oral  PainSc: 4          Complications: No apparent anesthesia complications

## 2016-12-01 NOTE — Discharge Instructions (Signed)
Drain care No heavy lifting Continue binder or sports bra Sink bath   Post Anesthesia Home Care Instructions  Activity: Get plenty of rest for the remainder of the day. A responsible individual must stay with you for 24 hours following the procedure.  For the next 24 hours, DO NOT: -Drive a car -Paediatric nurse -Drink alcoholic beverages -Take any medication unless instructed by your physician -Make any legal decisions or sign important papers.  Meals: Start with liquid foods such as gelatin or soup. Progress to regular foods as tolerated. Avoid greasy, spicy, heavy foods. If nausea and/or vomiting occur, drink only clear liquids until the nausea and/or vomiting subsides. Call your physician if vomiting continues.  Special Instructions/Symptoms: Your throat may feel dry or sore from the anesthesia or the breathing tube placed in your throat during surgery. If this causes discomfort, gargle with warm salt water. The discomfort should disappear within 24 hours.  If you had a scopolamine patch placed behind your ear for the management of post- operative nausea and/or vomiting:  1. The medication in the patch is effective for 72 hours, after which it should be removed.  Wrap patch in a tissue and discard in the trash. Wash hands thoroughly with soap and water. 2. You may remove the patch earlier than 72 hours if you experience unpleasant side effects which may include dry mouth, dizziness or visual disturbances. 3. Avoid touching the patch. Wash your hands with soap and water after contact with the patch.

## 2016-12-02 ENCOUNTER — Encounter (HOSPITAL_BASED_OUTPATIENT_CLINIC_OR_DEPARTMENT_OTHER): Payer: Self-pay | Admitting: Plastic Surgery

## 2017-02-07 ENCOUNTER — Encounter (HOSPITAL_BASED_OUTPATIENT_CLINIC_OR_DEPARTMENT_OTHER): Payer: Self-pay | Admitting: *Deleted

## 2017-02-07 ENCOUNTER — Ambulatory Visit: Payer: Self-pay | Admitting: Plastic Surgery

## 2017-02-07 DIAGNOSIS — N651 Disproportion of reconstructed breast: Secondary | ICD-10-CM

## 2017-02-07 DIAGNOSIS — Z9012 Acquired absence of left breast and nipple: Secondary | ICD-10-CM

## 2017-02-09 NOTE — Anesthesia Preprocedure Evaluation (Signed)
Anesthesia Evaluation  Patient identified by MRN, date of birth, ID band Patient awake    Reviewed: Allergy & Precautions, NPO status , Patient's Chart, lab work & pertinent test results  Airway Mallampati: II  TM Distance: >3 FB Neck ROM: Full    Dental no notable dental hx.    Pulmonary former smoker,    Pulmonary exam normal breath sounds clear to auscultation       Cardiovascular negative cardio ROS Normal cardiovascular exam Rhythm:Regular Rate:Normal     Neuro/Psych negative neurological ROS  negative psych ROS   GI/Hepatic negative GI ROS, Neg liver ROS,   Endo/Other  negative endocrine ROS  Renal/GU negative Renal ROS  negative genitourinary   Musculoskeletal negative musculoskeletal ROS (+)   Abdominal   Peds negative pediatric ROS (+)  Hematology negative hematology ROS (+)   Anesthesia Other Findings   Reproductive/Obstetrics negative OB ROS                             Anesthesia Physical  Anesthesia Plan  ASA: II  Anesthesia Plan: General   Post-op Pain Management:    Induction: Intravenous  PONV Risk Score and Plan: 3 and Ondansetron, Dexamethasone, Propofol and Midazolam  Airway Management Planned: Oral ETT  Additional Equipment:   Intra-op Plan:   Post-operative Plan: Extubation in OR  Informed Consent: I have reviewed the patients History and Physical, chart, labs and discussed the procedure including the risks, benefits and alternatives for the proposed anesthesia with the patient or authorized representative who has indicated his/her understanding and acceptance.   Dental advisory given  Plan Discussed with: CRNA  Anesthesia Plan Comments:         Anesthesia Quick Evaluation

## 2017-02-10 ENCOUNTER — Ambulatory Visit (HOSPITAL_BASED_OUTPATIENT_CLINIC_OR_DEPARTMENT_OTHER)
Admission: RE | Admit: 2017-02-10 | Discharge: 2017-02-10 | Disposition: A | Discharge status: Home / Self Care | Payer: 59 | Source: Ambulatory Visit | Attending: Plastic Surgery | Admitting: Plastic Surgery

## 2017-02-10 ENCOUNTER — Ambulatory Visit (HOSPITAL_BASED_OUTPATIENT_CLINIC_OR_DEPARTMENT_OTHER): Payer: 59 | Admitting: Anesthesiology

## 2017-02-10 ENCOUNTER — Encounter (HOSPITAL_BASED_OUTPATIENT_CLINIC_OR_DEPARTMENT_OTHER)
Admission: RE | Disposition: A | Discharge status: Home / Self Care | Payer: Self-pay | Source: Ambulatory Visit | Attending: Plastic Surgery

## 2017-02-10 ENCOUNTER — Encounter (HOSPITAL_BASED_OUTPATIENT_CLINIC_OR_DEPARTMENT_OTHER): Payer: Self-pay | Admitting: Emergency Medicine

## 2017-02-10 DIAGNOSIS — Z9012 Acquired absence of left breast and nipple: Secondary | ICD-10-CM | POA: Diagnosis not present

## 2017-02-10 DIAGNOSIS — I10 Essential (primary) hypertension: Secondary | ICD-10-CM | POA: Diagnosis not present

## 2017-02-10 DIAGNOSIS — G4733 Obstructive sleep apnea (adult) (pediatric): Secondary | ICD-10-CM | POA: Insufficient documentation

## 2017-02-10 DIAGNOSIS — Z87891 Personal history of nicotine dependence: Secondary | ICD-10-CM | POA: Insufficient documentation

## 2017-02-10 DIAGNOSIS — Z79899 Other long term (current) drug therapy: Secondary | ICD-10-CM | POA: Diagnosis not present

## 2017-02-10 DIAGNOSIS — Z853 Personal history of malignant neoplasm of breast: Secondary | ICD-10-CM | POA: Diagnosis not present

## 2017-02-10 DIAGNOSIS — Z9884 Bariatric surgery status: Secondary | ICD-10-CM | POA: Diagnosis not present

## 2017-02-10 DIAGNOSIS — Z9989 Dependence on other enabling machines and devices: Secondary | ICD-10-CM | POA: Insufficient documentation

## 2017-02-10 DIAGNOSIS — Z421 Encounter for breast reconstruction following mastectomy: Secondary | ICD-10-CM | POA: Diagnosis not present

## 2017-02-10 DIAGNOSIS — F419 Anxiety disorder, unspecified: Secondary | ICD-10-CM | POA: Insufficient documentation

## 2017-02-10 DIAGNOSIS — Z79810 Long term (current) use of selective estrogen receptor modulators (SERMs): Secondary | ICD-10-CM | POA: Insufficient documentation

## 2017-02-10 DIAGNOSIS — F329 Major depressive disorder, single episode, unspecified: Secondary | ICD-10-CM | POA: Insufficient documentation

## 2017-02-10 DIAGNOSIS — N6489 Other specified disorders of breast: Secondary | ICD-10-CM | POA: Insufficient documentation

## 2017-02-10 DIAGNOSIS — N651 Disproportion of reconstructed breast: Secondary | ICD-10-CM

## 2017-02-10 HISTORY — DX: Anxiety disorder, unspecified: F41.9

## 2017-02-10 HISTORY — PX: REMOVAL OF BILATERAL TISSUE EXPANDERS WITH PLACEMENT OF BILATERAL BREAST IMPLANTS: SHX6431

## 2017-02-10 HISTORY — PX: BREAST REDUCTION SURGERY: SHX8

## 2017-02-10 SURGERY — REMOVAL, TISSUE EXPANDER, BREAST, BILATERAL, WITH BILATERAL IMPLANT IMPLANT INSERTION
Anesthesia: General | Site: Breast | Laterality: Right

## 2017-02-10 MED ORDER — SODIUM CHLORIDE 0.9% FLUSH: 3.0000 mL | INTRAVENOUS | Status: DC | PRN

## 2017-02-10 MED ORDER — CEFAZOLIN SODIUM-DEXTROSE 2-4 GM/100ML-% IV SOLN
2.0000 g | INTRAVENOUS | Status: AC
  Administered 2017-02-10: 2 g via INTRAVENOUS

## 2017-02-10 MED ORDER — DEXAMETHASONE SODIUM PHOSPHATE 10 MG/ML IJ SOLN
INTRAMUSCULAR | Status: AC
  Filled 2017-02-10: qty 1

## 2017-02-10 MED ORDER — BUPIVACAINE-EPINEPHRINE (PF) 0.25% -1:200000 IJ SOLN
INTRAMUSCULAR | Status: AC
  Filled 2017-02-10: qty 30

## 2017-02-10 MED ORDER — SODIUM CHLORIDE 0.9% FLUSH: 3.0000 mL | Freq: Two times a day (BID) | INTRAVENOUS | Status: DC

## 2017-02-10 MED ORDER — MIDAZOLAM HCL 2 MG/2ML IJ SOLN
1.0000 mg | INTRAMUSCULAR | Status: DC | PRN
  Administered 2017-02-10: 2 mg via INTRAVENOUS

## 2017-02-10 MED ORDER — PHENYLEPHRINE 40 MCG/ML (10ML) SYRINGE FOR IV PUSH (FOR BLOOD PRESSURE SUPPORT)
PREFILLED_SYRINGE | INTRAVENOUS | Status: AC
  Filled 2017-02-10: qty 10

## 2017-02-10 MED ORDER — MIDAZOLAM HCL 2 MG/2ML IJ SOLN
INTRAMUSCULAR | Status: AC
  Filled 2017-02-10: qty 2

## 2017-02-10 MED ORDER — HYDROMORPHONE HCL 1 MG/ML IJ SOLN: 0.5000 mg | Freq: Once | INTRAMUSCULAR | Status: DC

## 2017-02-10 MED ORDER — EPHEDRINE SULFATE 50 MG/ML IJ SOLN
INTRAMUSCULAR | Status: DC | PRN
  Administered 2017-02-10: 10 mg via INTRAVENOUS

## 2017-02-10 MED ORDER — HYDROCODONE-ACETAMINOPHEN 5-325 MG PO TABS
1.0000 | ORAL_TABLET | Freq: Once | ORAL | Status: AC
  Administered 2017-02-10: 1 via ORAL

## 2017-02-10 MED ORDER — ROCURONIUM BROMIDE 100 MG/10ML IV SOLN
INTRAVENOUS | Status: DC | PRN
  Administered 2017-02-10: 40 mg via INTRAVENOUS

## 2017-02-10 MED ORDER — ONDANSETRON HCL 4 MG/2ML IJ SOLN
INTRAMUSCULAR | Status: AC
  Filled 2017-02-10: qty 2

## 2017-02-10 MED ORDER — FENTANYL CITRATE (PF) 100 MCG/2ML IJ SOLN
INTRAMUSCULAR | Status: AC
  Filled 2017-02-10: qty 2

## 2017-02-10 MED ORDER — ACETAMINOPHEN 325 MG PO TABS: 650.0000 mg | ORAL_TABLET | ORAL | Status: DC | PRN

## 2017-02-10 MED ORDER — HYDROMORPHONE HCL 1 MG/ML IJ SOLN
0.2500 mg | INTRAMUSCULAR | Status: DC | PRN
  Administered 2017-02-10 (×2): 0.25 mg via INTRAVENOUS
  Administered 2017-02-10: 0.5 mg via INTRAVENOUS

## 2017-02-10 MED ORDER — HYDROMORPHONE HCL 1 MG/ML IJ SOLN
INTRAMUSCULAR | Status: AC
  Filled 2017-02-10: qty 0.5

## 2017-02-10 MED ORDER — SUCCINYLCHOLINE CHLORIDE 200 MG/10ML IV SOSY
PREFILLED_SYRINGE | INTRAVENOUS | Status: AC
  Filled 2017-02-10: qty 10

## 2017-02-10 MED ORDER — ACETAMINOPHEN 650 MG RE SUPP: 650.0000 mg | RECTAL | Status: DC | PRN

## 2017-02-10 MED ORDER — SUGAMMADEX SODIUM 200 MG/2ML IV SOLN
INTRAVENOUS | Status: DC | PRN
  Administered 2017-02-10: 150 mg via INTRAVENOUS

## 2017-02-10 MED ORDER — BUPIVACAINE-EPINEPHRINE (PF) 0.5% -1:200000 IJ SOLN
INTRAMUSCULAR | Status: AC
  Filled 2017-02-10: qty 30

## 2017-02-10 MED ORDER — CEFAZOLIN SODIUM-DEXTROSE 2-4 GM/100ML-% IV SOLN
INTRAVENOUS | Status: AC
  Filled 2017-02-10: qty 100

## 2017-02-10 MED ORDER — LIDOCAINE HCL (PF) 1 % IJ SOLN
INTRAMUSCULAR | Status: AC
  Filled 2017-02-10: qty 60

## 2017-02-10 MED ORDER — SCOPOLAMINE 1 MG/3DAYS TD PT72: 1.0000 | MEDICATED_PATCH | Freq: Once | TRANSDERMAL | Status: DC | PRN

## 2017-02-10 MED ORDER — DEXAMETHASONE SODIUM PHOSPHATE 4 MG/ML IJ SOLN
INTRAMUSCULAR | Status: DC | PRN
  Administered 2017-02-10: 10 mg via INTRAVENOUS

## 2017-02-10 MED ORDER — LACTATED RINGERS IV SOLN
INTRAVENOUS | Status: DC
  Administered 2017-02-10 (×2): via INTRAVENOUS

## 2017-02-10 MED ORDER — EPINEPHRINE 30 MG/30ML IJ SOLN
INTRAMUSCULAR | Status: AC
  Filled 2017-02-10: qty 1

## 2017-02-10 MED ORDER — ROCURONIUM BROMIDE 10 MG/ML (PF) SYRINGE
PREFILLED_SYRINGE | INTRAVENOUS | Status: AC
  Filled 2017-02-10: qty 5

## 2017-02-10 MED ORDER — EPHEDRINE 5 MG/ML INJ
INTRAVENOUS | Status: AC
  Filled 2017-02-10: qty 10

## 2017-02-10 MED ORDER — SODIUM CHLORIDE 0.9 % IV SOLN: 250.0000 mL | INTRAVENOUS | Status: DC | PRN

## 2017-02-10 MED ORDER — PROPOFOL 10 MG/ML IV BOLUS
INTRAVENOUS | Status: DC | PRN
  Administered 2017-02-10: 150 mg via INTRAVENOUS

## 2017-02-10 MED ORDER — SODIUM CHLORIDE 0.9 % IV SOLN
INTRAVENOUS | Status: DC | PRN
  Administered 2017-02-10: 500 mL

## 2017-02-10 MED ORDER — BUPIVACAINE-EPINEPHRINE 0.25% -1:200000 IJ SOLN
INTRAMUSCULAR | Status: DC | PRN
  Administered 2017-02-10: 10 mL

## 2017-02-10 MED ORDER — OXYCODONE HCL 5 MG PO TABS: 5.0000 mg | ORAL_TABLET | ORAL | Status: DC | PRN

## 2017-02-10 MED ORDER — LIDOCAINE HCL (CARDIAC) 20 MG/ML IV SOLN
INTRAVENOUS | Status: DC | PRN
  Administered 2017-02-10: 40 mg via INTRAVENOUS

## 2017-02-10 MED ORDER — LIDOCAINE 2% (20 MG/ML) 5 ML SYRINGE
INTRAMUSCULAR | Status: AC
  Filled 2017-02-10: qty 5

## 2017-02-10 MED ORDER — LACTATED RINGERS IV SOLN
INTRAVENOUS | Status: AC | PRN
  Administered 2017-02-10: 250 mL via INTRAVENOUS

## 2017-02-10 MED ORDER — EPINEPHRINE PF 1 MG/ML IJ SOLN
INTRAMUSCULAR | Status: DC | PRN
  Administered 2017-02-10: 1 mg

## 2017-02-10 MED ORDER — PROMETHAZINE HCL 25 MG/ML IJ SOLN: 6.2500 mg | INTRAMUSCULAR | Status: DC | PRN

## 2017-02-10 MED ORDER — HYDROCODONE-ACETAMINOPHEN 5-325 MG PO TABS
ORAL_TABLET | ORAL | Status: AC
  Filled 2017-02-10: qty 1

## 2017-02-10 MED ORDER — FENTANYL CITRATE (PF) 100 MCG/2ML IJ SOLN
50.0000 ug | INTRAMUSCULAR | Status: AC | PRN
  Administered 2017-02-10: 100 ug via INTRAVENOUS
  Administered 2017-02-10 (×2): 50 ug via INTRAVENOUS

## 2017-02-10 SURGICAL SUPPLY — 78 items
BAG DECANTER FOR FLEXI CONT (MISCELLANEOUS) ×4 IMPLANT
BINDER BREAST LRG (GAUZE/BANDAGES/DRESSINGS) ×4 IMPLANT
BINDER BREAST MEDIUM (GAUZE/BANDAGES/DRESSINGS) IMPLANT
BINDER BREAST XLRG (GAUZE/BANDAGES/DRESSINGS) IMPLANT
BINDER BREAST XXLRG (GAUZE/BANDAGES/DRESSINGS) IMPLANT
BIOPATCH RED 1 DISK 7.0 (GAUZE/BANDAGES/DRESSINGS) IMPLANT
BIOPATCH RED 1IN DISK 7.0MM (GAUZE/BANDAGES/DRESSINGS)
BLADE HEX COATED 2.75 (ELECTRODE) ×4 IMPLANT
BLADE KNIFE PERSONA 10 (BLADE) ×8 IMPLANT
BLADE SURG 15 STRL LF DISP TIS (BLADE) ×4 IMPLANT
BLADE SURG 15 STRL SS (BLADE) ×4
BNDG GAUZE ELAST 4 BULKY (GAUZE/BANDAGES/DRESSINGS) ×8 IMPLANT
CANISTER SUCT 1200ML W/VALVE (MISCELLANEOUS) ×8 IMPLANT
CHLORAPREP W/TINT 26ML (MISCELLANEOUS) ×4 IMPLANT
CLOSURE STERI-STRIP 1/2X4 (GAUZE/BANDAGES/DRESSINGS) ×2
CLSR STERI-STRIP ANTIMIC 1/2X4 (GAUZE/BANDAGES/DRESSINGS) ×6 IMPLANT
CORD BIPOLAR FORCEPS 12FT (ELECTRODE) IMPLANT
COVER BACK TABLE 60X90IN (DRAPES) ×4 IMPLANT
COVER MAYO STAND STRL (DRAPES) ×4 IMPLANT
DECANTER SPIKE VIAL GLASS SM (MISCELLANEOUS) IMPLANT
DERMABOND ADVANCED (GAUZE/BANDAGES/DRESSINGS) ×4
DERMABOND ADVANCED .7 DNX12 (GAUZE/BANDAGES/DRESSINGS) ×4 IMPLANT
DRAIN CHANNEL 19F RND (DRAIN) IMPLANT
DRAPE LAPAROSCOPIC ABDOMINAL (DRAPES) ×4 IMPLANT
DRSG PAD ABDOMINAL 8X10 ST (GAUZE/BANDAGES/DRESSINGS) ×8 IMPLANT
ELECT BLADE 4.0 EZ CLEAN MEGAD (MISCELLANEOUS) ×4
ELECT REM PT RETURN 9FT ADLT (ELECTROSURGICAL) ×4
ELECTRODE BLDE 4.0 EZ CLN MEGD (MISCELLANEOUS) ×2 IMPLANT
ELECTRODE REM PT RTRN 9FT ADLT (ELECTROSURGICAL) ×2 IMPLANT
EVACUATOR SILICONE 100CC (DRAIN) IMPLANT
GAUZE SPONGE 4X4 12PLY STRL LF (GAUZE/BANDAGES/DRESSINGS) IMPLANT
GLOVE BIO SURGEON STRL SZ 6.5 (GLOVE) ×12 IMPLANT
GLOVE BIO SURGEONS STRL SZ 6.5 (GLOVE) ×4
GOWN STRL REUS W/ TWL LRG LVL3 (GOWN DISPOSABLE) ×6 IMPLANT
GOWN STRL REUS W/TWL LRG LVL3 (GOWN DISPOSABLE) ×6
IMPL BREAST GEL HP 400CC (Breast) ×2 IMPLANT
IMPLANT BREAST GEL HP 400CC (Breast) ×4 IMPLANT
IV NS 1000ML (IV SOLUTION)
IV NS 1000ML BAXH (IV SOLUTION) IMPLANT
IV NS 500ML (IV SOLUTION)
IV NS 500ML BAXH (IV SOLUTION) IMPLANT
KIT FILL SYSTEM UNIVERSAL (SET/KITS/TRAYS/PACK) IMPLANT
NDL SAFETY ECLIPSE 18X1.5 (NEEDLE) ×2 IMPLANT
NEEDLE HYPO 18GX1.5 SHARP (NEEDLE) ×2
NEEDLE HYPO 25X1 1.5 SAFETY (NEEDLE) ×4 IMPLANT
NS IRRIG 1000ML POUR BTL (IV SOLUTION) IMPLANT
PACK BASIN DAY SURGERY FS (CUSTOM PROCEDURE TRAY) ×4 IMPLANT
PAD ALCOHOL SWAB (MISCELLANEOUS) IMPLANT
PENCIL BUTTON HOLSTER BLD 10FT (ELECTRODE) ×4 IMPLANT
PIN SAFETY STERILE (MISCELLANEOUS) IMPLANT
SIZER BREAST REUSE 400CC (SIZER) ×2
SIZER BRST REUSE 400CC (SIZER) ×2 IMPLANT
SLEEVE SCD COMPRESS KNEE MED (MISCELLANEOUS) ×4 IMPLANT
SPONGE LAP 18X18 X RAY DECT (DISPOSABLE) ×8 IMPLANT
STRIP SUTURE WOUND CLOSURE 1/2 (SUTURE) ×8 IMPLANT
SUT MNCRL AB 4-0 PS2 18 (SUTURE) ×8 IMPLANT
SUT MON AB 3-0 SH 27 (SUTURE) ×6
SUT MON AB 3-0 SH27 (SUTURE) ×6 IMPLANT
SUT MON AB 5-0 PS2 18 (SUTURE) ×8 IMPLANT
SUT PDS 3-0 CT2 (SUTURE)
SUT PDS AB 2-0 CT2 27 (SUTURE) IMPLANT
SUT PDS II 3-0 CT2 27 ABS (SUTURE) IMPLANT
SUT SILK 3 0 PS 1 (SUTURE) IMPLANT
SUT VIC AB 3-0 SH 27 (SUTURE) ×2
SUT VIC AB 3-0 SH 27X BRD (SUTURE) ×2 IMPLANT
SUT VICRYL 4-0 PS2 18IN ABS (SUTURE) ×8 IMPLANT
SYR 3ML 23GX1 SAFETY (SYRINGE) ×4 IMPLANT
SYR 50ML LL SCALE MARK (SYRINGE) IMPLANT
SYR BULB IRRIGATION 50ML (SYRINGE) ×4 IMPLANT
SYR CONTROL 10ML LL (SYRINGE) ×4 IMPLANT
TAPE MEASURE VINYL STERILE (MISCELLANEOUS) ×4 IMPLANT
TOWEL OR 17X24 6PK STRL BLUE (TOWEL DISPOSABLE) ×8 IMPLANT
TUBE CONNECTING 20'X1/4 (TUBING) ×1
TUBE CONNECTING 20X1/4 (TUBING) ×3 IMPLANT
TUBING INFILTRATION IT-10001 (TUBING) ×4 IMPLANT
TUBING SET GRADUATE ASPIR 12FT (MISCELLANEOUS) ×4 IMPLANT
UNDERPAD 30X30 (UNDERPADS AND DIAPERS) ×8 IMPLANT
YANKAUER SUCT BULB TIP NO VENT (SUCTIONS) ×8 IMPLANT

## 2017-02-10 NOTE — Anesthesia Procedure Notes (Signed)
Procedure Name: Intubation Date/Time: 02/10/2017 7:42 AM Performed by: Melynda Ripple D Pre-anesthesia Checklist: Patient identified, Emergency Drugs available, Suction available and Patient being monitored Patient Re-evaluated:Patient Re-evaluated prior to induction Oxygen Delivery Method: Circle system utilized Preoxygenation: Pre-oxygenation with 100% oxygen Induction Type: IV induction Ventilation: Mask ventilation without difficulty Laryngoscope Size: Mac and 3 Grade View: Grade II Tube type: Oral Tube size: 7.0 mm Number of attempts: 1 Airway Equipment and Method: Stylet and Oral airway Placement Confirmation: ETT inserted through vocal cords under direct vision,  positive ETCO2 and breath sounds checked- equal and bilateral Secured at: 22 cm Tube secured with: Tape Dental Injury: Teeth and Oropharynx as per pre-operative assessment

## 2017-02-10 NOTE — Op Note (Signed)
Op report Unilateral Breast Exchange   DATE OF OPERATION:  02/10/2017  LOCATION: [facility]  SURGICAL DIVISION: Plastic Surgery  PREOPERATIVE DIAGNOSES:  1. History of left breast cancer.  2. Acquired absence of left breast.  3. Post surgical breast asymmetry.  POSTOPERATIVE DIAGNOSES:  1. History of left breast cancer.  2. Acquired absence of left breast.  3. Right breast reduction / mastopexy.  PROCEDURE:  1. Exchange of tissue expander for implant. 2. Capsulotomies for implant respositioning. 3. Right breast reduction / mastopexy 325 gm  SURGEON: Fayne Mcguffee Sanger Soraiya Ahner, DO  ASSISTANT: Shawn Rayburn, PA  ANESTHESIA:  General.   COMPLICATIONS: None.   IMPLANTS: Mentor Smooth Round Ultra High Profile Gel 400cc. Ref #[id number].  Serial Number [id number]  INDICATIONS FOR PROCEDURE:  The patient, Nancy Perry, is a 56 y.o. female born on 1960/09/18, is here for treatment for further treatment after a mastectomy and placement of a tissue expander. She now presents for exchange of her expander for an implant.  She requires capsulotomies to better position the implant. MRN: [id number]  CONSENT:  Informed consent was obtained directly from the patient. Risks, benefits and alternatives were fully discussed. Specific risks including but not limited to bleeding, infection, hematoma, seroma, scarring, pain, implant infection, implant extrusion, capsular contracture, asymmetry, wound healing problems, and need for further surgery were all discussed. The patient did have an ample opportunity to have her questions answered to her satisfaction.   DESCRIPTION OF PROCEDURE:  The patient was taken to the operating room. SCDs were placed and IV antibiotics were given. The patient's chest was prepped and draped in a sterile fashion. A time out was performed and the implants to be used were identified.  One percent Xylocaine with epinephrine was used to infiltrate  the area.   LEFT: The old mastectomy scar was opened and superior mastectomy and inferior mastectomy flaps were re-raised over the pectoralis major muscle. The pectoralis was split to expose the tissue expander which was removed. Inspection of the pocket showed a normal healthy capsule and good integration of the biologic matrix.   Circumferential capsulotomies were performed to allow for breast pocket expansion.  Measurements were made to confirm adequate pocket size for the implant dimensions.  Hemostasis was ensured with electrocautery.  The pocket was irrigated with antibiotic solution.  New gloves were placed.  The implant was placed in the pocket and oriented appropriately. The pectoralis major muscle and capsule on the anterior surface were re-closed with a 3-0 running Monocryl suture. The remaining skin was closed with 4-0 Monocryl deep dermal and 5-0 Monocryl subcuticular stitches.  Dermabond was applied.  A breast binder and ABD was applied.  The patient was allowed to wake from anesthesia and taken to the recovery room in satisfactory condition.    Right side: Preoperative markings were confirmed. Tumescent was placed in the right lower breast.  Liposuction was done in the lower and lower lateral breast for improved contour and shape.   Incision lines were injected with 1% Xylocaine with epinephrine.  After waiting for vasoconstriction, the marked lines were incised.  A Wise-pattern superomedial breast reduction was performed by de-epithelializing the pedicle, using bovie to create the superomedial pedicle, and removing breast tissue from the superior, lateral, and inferior portions of the breast.  Care was taken to not undermine the breast pedicle. Hemostasis was achieved.  The nipple was gently rotated into position and the soft tissue closed with 4-0 Monocryl.   The pocket was irrigated and  hemostasis confirmed.  The deep tissues were approximated with 3-0 monocryl sutures and the skin was  closed with deep dermal and subcuticular 4-0 Monocryl sutures.  The nipple and skin flaps had good capillary refill at the end of the procedure.  Dermabond was applied.  A breast binder and ABDs were placed.  The nipple and skin flaps had good capillary refill at the end of the procedure.  The patient tolerated the procedure well. The patient was allowed to wake from anesthesia and taken to the recovery room in satisfactory condition

## 2017-02-10 NOTE — Anesthesia Postprocedure Evaluation (Signed)
Anesthesia Post Note  Patient: Nancy Perry  Procedure(s) Performed: Procedure(s) (LRB): REMOVAL OF LEFT TISSUE EXPANDERS WITH PLACEMENT OF LEFTL BREAST IMPLANTS (Left) MAMMARY REDUCTION  ON RIGHT SIDE (Right)     Patient location during evaluation: PACU Anesthesia Type: General Level of consciousness: awake and alert Pain management: pain level controlled Vital Signs Assessment: post-procedure vital signs reviewed and stable Respiratory status: spontaneous breathing, nonlabored ventilation, respiratory function stable and patient connected to nasal cannula oxygen Cardiovascular status: blood pressure returned to baseline and stable Postop Assessment: no apparent nausea or vomiting Anesthetic complications: no    Last Vitals:  Vitals:   02/10/17 1115 02/10/17 1145  BP: 122/67 137/75  Pulse: 89 87  Resp: 17 16  Temp:  36.9 C  SpO2: 94% 97%    Last Pain:  Vitals:   02/10/17 1145  TempSrc:   PainSc: 3                  Tiajuana Amass

## 2017-02-10 NOTE — H&P (Signed)
Nancy Perry is an 56 y.o. female.   Chief Complaint: acquired absence of left breast HPI: The patient is a 56 yrs old wf here for secondary left breast reconstruction with removal of expander and placement of left breast silicone implant and right breast reduction/mastpexy for symmetry.  Expander has 360/300 cc History:  Routine mammogram and was found to have breast asymmetry with calcifications.  The biopsy showed ductal carcinoma in situ and she underwent a mastectomy in May 2018 by Dr. Noberto Retort. Two lymph nodes were removed and both negative.  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  She is 5 feet 3 inches tall, weight is 145 pounds after 100 pounds weight loss with gastric bypass surgery in 2015.  Preop bra + 36 F. She did not receive any radiation.  She is interested in reconstruction and symmetry surgery.  She vapes but reports no nicotine in the liquid.   Past Medical History:  Diagnosis Date  . Anxiety   . Back pain, chronic   . Cancer ([facility]) 09/2016   left breast cancer  . Depression   . Fatty liver    "states elevated liver enzymes"  . History of Roux-en-Y gastric bypass    pt has lost 100 lbs  . Hypertension    had gastric bypass, lost 100 lbs, no meds  . OSA on CPAP    lost 100 lbs, does not use CPAP anymore  . Rotator cuff tear    work related accident (Left shoulder)-01-11-14 limited ROM    Past Surgical History:  Procedure Laterality Date  . ABDOMINAL HYSTERECTOMY  1998  . BREAST RECONSTRUCTION WITH PLACEMENT OF TISSUE EXPANDER AND FLEX HD (ACELLULAR HYDRATED DERMIS) Left 12/01/2016   Procedure: LEFT BREAST RECONSTRUCTION WITH PLACEMENT OF TISSUE EXPANDER AND FLEX HD (ACELLULAR HYDRATED DERMIS);  Surgeon: Wallace Going, DO;  Location: [facility];  Service: Plastics;  Laterality: Left;  . CHOLECYSTECTOMY     Laparoscopic"gallstones" 11-28-13 [facility]   . GASTRIC ROUX-EN-Y N/A 01/15/2014   Procedure:  LAPAROSCOPIC ROUX-EN-Y GASTRIC BYPASS WITH UPPER ENDOSCOPY;  Surgeon: Kaylyn Lim, MD;  Location: [facility];  Service: General;  Laterality: N/A;  . MASTECTOMY Left 09/2016   at [facility]    History reviewed. No pertinent family history. Social History:  reports that she quit smoking about 4 years ago. She has never used smokeless tobacco. She reports that she does not drink alcohol or use drugs.  Allergies:  Allergies  Allergen Reactions  . Morphine And Related Swelling  . Ivp Dye [Iodinated Diagnostic Agents] Swelling    Facial swelling and rash  . Oxycodone Nausea Only    Itching, OK to take Vicodin    Medications Prior to Admission  Medication Sig Dispense Refill  . COCONUT OIL PO Take by mouth.    . Cyanocobalamin (B-12) 1000 MCG CAPS Take by mouth.    . DULoxetine (CYMBALTA) 60 MG capsule Take 60 mg by mouth every morning.     . Ferrous Sulfate (IRON) 325 (65 Fe) MG TABS Take by mouth.    . Multiple Vitamin (MULTIVITAMIN WITH MINERALS) TABS tablet Take 1 tablet by mouth daily.    Marland Kitchen PRE PROTEIN POWD Take by mouth.    . promethazine (PHENERGAN) 25 MG tablet Take 25 mg by mouth every 6 (six) hours as needed for nausea or vomiting.    . tamoxifen (NOLVADEX) 20 MG tablet Take 20 mg by mouth daily.    Marland Kitchen  traMADol (ULTRAM) 50 MG tablet Take by mouth 4 (four) times daily.    Marland Kitchen zolpidem (AMBIEN) 10 MG tablet Take 10 mg by mouth at bedtime as needed for sleep.    . polyethylene glycol (MIRALAX / GLYCOLAX) packet Take 17 g by mouth daily.      No results found for this or any previous visit (from the past 48 hour(s)). No results found.  Review of Systems  Constitutional: Negative.   HENT: Negative.   Eyes: Negative.   Respiratory: Negative.   Cardiovascular: Negative.   Gastrointestinal: Negative.   Genitourinary: Negative.   Musculoskeletal: Negative.   Skin: Negative.   Neurological: Negative.   Psychiatric/Behavioral: Negative.     Blood  pressure 131/71, pulse (!) 58, temperature 97.8 F (36.6 C), temperature source Oral, resp. rate 18, height 5\' 3"  (1.6 m), weight 65.3 kg (144 lb), SpO2 98 %. Physical Exam  Constitutional: She is oriented to person, place, and time. She appears well-developed and well-nourished.  HENT:  Head: Normocephalic and atraumatic.  Eyes: Pupils are equal, round, and reactive to light. Conjunctivae and EOM are normal.  Cardiovascular: Normal rate.   Respiratory: Effort normal.  GI: Soft. She exhibits no distension. There is no tenderness.  Neurological: She is alert and oriented to person, place, and time.  Skin: Skin is warm.  Psychiatric: She has a normal mood and affect. Her behavior is normal. Judgment and thought content normal.     Assessment/Plan Plan removal of left breast tissue expander and placement of left breast silicone implant and right breast reduction/mastopexy for symmetry.   Wallace Going, DO 02/10/2017, 7:14 AM

## 2017-02-10 NOTE — Discharge Instructions (Signed)
No heavy lifting May shower tomorrow Continue binder or sports bra    Call your surgeon if you experience:   1.  Fever over 101.0. 2.  Inability to urinate. 3.  Nausea and/or vomiting. 4.  Extreme swelling or bruising at the surgical site. 5.  Continued bleeding from the incision. 6.  Increased pain, redness or drainage from the incision. 7.  Problems related to your pain medication. 8.  Any problems and/or concerns    Post Anesthesia Home Care Instructions  Activity: Get plenty of rest for the remainder of the day. A responsible individual must stay with you for 24 hours following the procedure.  For the next 24 hours, DO NOT: -Drive a car -Paediatric nurse -Drink alcoholic beverages -Take any medication unless instructed by your physician -Make any legal decisions or sign important papers.  Meals: Start with liquid foods such as gelatin or soup. Progress to regular foods as tolerated. Avoid greasy, spicy, heavy foods. If nausea and/or vomiting occur, drink only clear liquids until the nausea and/or vomiting subsides. Call your physician if vomiting continues.  Special Instructions/Symptoms: Your throat may feel dry or sore from the anesthesia or the breathing tube placed in your throat during surgery. If this causes discomfort, gargle with warm salt water. The discomfort should disappear within 24 hours.  If you had a scopolamine patch placed behind your ear for the management of post- operative nausea and/or vomiting:  1. The medication in the patch is effective for 72 hours, after which it should be removed.  Wrap patch in a tissue and discard in the trash. Wash hands thoroughly with soap and water. 2. You may remove the patch earlier than 72 hours if you experience unpleasant side effects which may include dry mouth, dizziness or visual disturbances. 3. Avoid touching the patch. Wash your hands with soap and water after contact with the patch.

## 2017-02-10 NOTE — Transfer of Care (Signed)
Immediate Anesthesia Transfer of Care Note  Patient: Nancy Perry  Procedure(s) Performed: Procedure(s): REMOVAL OF LEFT TISSUE EXPANDERS WITH PLACEMENT OF LEFTL BREAST IMPLANTS (Left) MAMMARY REDUCTION  ON RIGHT SIDE (Right)  Patient Location: PACU  Anesthesia Type:General  Level of Consciousness: awake, alert  and oriented  Airway & Oxygen Therapy: Patient Spontanous Breathing and Patient connected to face mask oxygen  Post-op Assessment: Report given to RN and Post -op Vital signs reviewed and stable  Post vital signs: Reviewed and stable  Last Vitals:  Vitals:   02/10/17 0640 02/10/17 1009  BP: 131/71 (!) 145/74  Pulse: (!) 58 (!) 108  Resp: 18 16  Temp: 36.6 C   SpO2: 98% 97%    Last Pain:  Vitals:   02/10/17 0640  TempSrc: Oral  PainSc: 0-No pain         Complications: No apparent anesthesia complications

## 2017-02-11 ENCOUNTER — Encounter (HOSPITAL_BASED_OUTPATIENT_CLINIC_OR_DEPARTMENT_OTHER): Payer: Self-pay | Admitting: Plastic Surgery

## 2017-05-11 DIAGNOSIS — Z9012 Acquired absence of left breast and nipple: Secondary | ICD-10-CM | POA: Diagnosis not present

## 2017-05-11 DIAGNOSIS — Z79810 Long term (current) use of selective estrogen receptor modulators (SERMs): Secondary | ICD-10-CM

## 2017-05-11 DIAGNOSIS — D0512 Intraductal carcinoma in situ of left breast: Secondary | ICD-10-CM | POA: Diagnosis not present

## 2017-05-11 DIAGNOSIS — Z170 Estrogen receptor positive status [ER+]: Secondary | ICD-10-CM | POA: Diagnosis not present

## 2017-06-29 ENCOUNTER — Encounter (HOSPITAL_BASED_OUTPATIENT_CLINIC_OR_DEPARTMENT_OTHER): Payer: Self-pay | Admitting: *Deleted

## 2017-07-06 ENCOUNTER — Ambulatory Visit: Payer: Self-pay | Admitting: Plastic Surgery

## 2017-07-06 DIAGNOSIS — N651 Disproportion of reconstructed breast: Secondary | ICD-10-CM

## 2017-07-06 DIAGNOSIS — Z9012 Acquired absence of left breast and nipple: Secondary | ICD-10-CM

## 2017-07-06 NOTE — H&P (Signed)
Nancy Perry is an 57 y.o. female.   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 The left breast has a Mentor smooth round Ultra Hight profile 400 cc gel implant.  placement of silicone implant.  She also had a right breast reduction for symmetry 02/10/17. She has reasonable symmetry but not able to fully fit into one bra.  The left has volume loss inferiorly and the right is fuller.  She is interested in a slight reduction on the right and lipofilling on the left. Which is a good idea because we won't be able to fill the left enough to compensate for the difference.    History:  Routine mammogram and was found to have breast asymmetry with calcifications.  The biopsy showed ductal carcinoma in situ and she underwent a mastectomy in May 2018 by Dr. Noberto Retort. Two lymph nodes were removed and both negative.  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  She is 5 feet 3 inches tall, weight is 145 pounds after 100 pounds weight loss with gastric bypass surgery in 2015.  Preop bra + 36 F. She did not receive any radiation.  She is interested in reconstruction and symmetry surgery.  She vapes but reports no nicotine in the liquid.   Past Medical History:  Diagnosis Date  . Anxiety   . Back pain, chronic   . Cancer ([place]) 09/2016   left breast cancer  . Depression   . Fatty liver    "states elevated liver enzymes"  . History of Roux-en-Y gastric bypass    pt has lost 100 lbs  . Hypertension    had gastric bypass, lost 100 lbs,   . Rotator cuff tear    work related accident (Left shoulder)-01-11-14 limited ROM    Past Surgical History:  Procedure Laterality Date  . ABDOMINAL HYSTERECTOMY  1998  . BREAST RECONSTRUCTION WITH PLACEMENT OF TISSUE EXPANDER AND FLEX HD (ACELLULAR HYDRATED DERMIS) Left 12/01/2016   Procedure: LEFT BREAST RECONSTRUCTION  WITH PLACEMENT OF TISSUE EXPANDER AND FLEX HD (ACELLULAR HYDRATED DERMIS);  Surgeon: Wallace Going, DO;  Location: [place];  Service: Plastics;  Laterality: Left;  . BREAST REDUCTION SURGERY Right 02/10/2017   Procedure: MAMMARY REDUCTION  ON RIGHT SIDE;  Surgeon: Wallace Going, DO;  Location: [place];  Service: Plastics;  Laterality: Right;  . CHOLECYSTECTOMY     Laparoscopic"gallstones" 11-28-13 [facility]   . GASTRIC ROUX-EN-Y N/A 01/15/2014   Procedure: LAPAROSCOPIC ROUX-EN-Y GASTRIC BYPASS WITH UPPER ENDOSCOPY;  Surgeon: Kaylyn Lim, MD;  Location: [facility];  Service: General;  Laterality: N/A;  . MASTECTOMY Left 09/2016   at [place] OF BILATERAL BREAST IMPLANTS Left 02/10/2017   Procedure: REMOVAL OF LEFT TISSUE EXPANDERS WITH PLACEMENT OF LEFTL BREAST IMPLANTS;  Surgeon: Wallace Going, DO;  Location: [place];  Service: Plastics;  Laterality: Left;  . TONSILLECTOMY  1975    No family history on file. Social History:  reports that she quit smoking about 5 years ago. she has never used smokeless tobacco. She reports that she does not drink alcohol or use drugs.  Allergies:  Allergies  Allergen Reactions  . Morphine And Related Swelling  . Ivp Dye [Iodinated Diagnostic Agents] Swelling    Facial swelling and rash  .  Oxycodone Nausea Only    Itching, OK to take Vicodin     (Not in a hospital admission)  No results found for this or any previous visit (from the past 48 hour(s)). No results found.  Review of Systems  Constitutional: Negative.   HENT: Negative.   Eyes: Negative.   Respiratory: Negative.   Cardiovascular: Negative.   Gastrointestinal: Negative.   Genitourinary: Negative.   Musculoskeletal: Negative.   Skin: Negative.   Neurological: Negative.   Psychiatric/Behavioral: Negative.     There were no vitals taken for this  visit. Physical Exam  Constitutional: She is oriented to person, place, and time. She appears well-developed and well-nourished.  HENT:  Head: Normocephalic and atraumatic.  Eyes: EOM are normal. Pupils are equal, round, and reactive to light.  Cardiovascular: Normal rate.  Respiratory: Effort normal. No respiratory distress.  GI: Soft. She exhibits no distension.  Neurological: She is alert and oriented to person, place, and time.  Skin: Skin is warm.  Psychiatric: She has a normal mood and affect. Her behavior is normal. Judgment and thought content normal.     Assessment/Plan Right breast reduction revision and left breast lipofilling for symmetry.  [place], DO 07/06/2017, 8:09 AM

## 2017-07-06 NOTE — H&P (View-Only) (Signed)
Nancy Perry is an 57 y.o. female.   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 The left breast has a Mentor smooth round Ultra Hight profile 400 cc gel implant.  placement of silicone implant.  She also had a right breast reduction for symmetry 02/10/17. She has reasonable symmetry but not able to fully fit into one bra.  The left has volume loss inferiorly and the right is fuller.  She is interested in a slight reduction on the right and lipofilling on the left. Which is a good idea because we won't be able to fill the left enough to compensate for the difference.    History:  Routine mammogram and was found to have breast asymmetry with calcifications.  The biopsy showed ductal carcinoma in situ and she underwent a mastectomy in May 2018 by Dr. Noberto Retort. Two lymph nodes were removed and both negative.  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  She is 5 feet 3 inches tall, weight is 145 pounds after 100 pounds weight loss with gastric bypass surgery in 2015.  Preop bra + 36 F. She did not receive any radiation.  She is interested in reconstruction and symmetry surgery.  She vapes but reports no nicotine in the liquid.   Past Medical History:  Diagnosis Date  . Anxiety   . Back pain, chronic   . Cancer ([place]) 09/2016   left breast cancer  . Depression   . Fatty liver    "states elevated liver enzymes"  . History of Roux-en-Y gastric bypass    pt has lost 100 lbs  . Hypertension    had gastric bypass, lost 100 lbs,   . Rotator cuff tear    work related accident (Left shoulder)-01-11-14 limited ROM    Past Surgical History:  Procedure Laterality Date  . ABDOMINAL HYSTERECTOMY  1998  . BREAST RECONSTRUCTION WITH PLACEMENT OF TISSUE EXPANDER AND FLEX HD (ACELLULAR HYDRATED DERMIS) Left 12/01/2016   Procedure: LEFT BREAST RECONSTRUCTION  WITH PLACEMENT OF TISSUE EXPANDER AND FLEX HD (ACELLULAR HYDRATED DERMIS);  Surgeon: Wallace Going, DO;  Location: [place];  Service: Plastics;  Laterality: Left;  . BREAST REDUCTION SURGERY Right 02/10/2017   Procedure: MAMMARY REDUCTION  ON RIGHT SIDE;  Surgeon: Wallace Going, DO;  Location: [place];  Service: Plastics;  Laterality: Right;  . CHOLECYSTECTOMY     Laparoscopic"gallstones" 11-28-13 [facility]   . GASTRIC ROUX-EN-Y N/A 01/15/2014   Procedure: LAPAROSCOPIC ROUX-EN-Y GASTRIC BYPASS WITH UPPER ENDOSCOPY;  Surgeon: Kaylyn Lim, MD;  [facility];  Service: General;  Laterality: N/A;  . MASTECTOMY Left 09/2016   at [place] OF BILATERAL BREAST IMPLANTS Left 02/10/2017   Procedure: REMOVAL OF LEFT TISSUE EXPANDERS WITH PLACEMENT OF LEFTL BREAST IMPLANTS;  Surgeon: Wallace Going, DO;  Location: [place];  Service: Plastics;  Laterality: Left;  . TONSILLECTOMY  1975    No family history on file. Social History:  reports that she quit smoking about 5 years ago. she has never used smokeless tobacco. She reports that she does not drink alcohol or use drugs.  Allergies:  Allergies  Allergen Reactions  . Morphine And Related Swelling  . Ivp Dye [Iodinated Diagnostic Agents] Swelling    Facial swelling and rash  .  Oxycodone Nausea Only    Itching, OK to take Vicodin     (Not in a hospital admission)  No results found for this or any previous visit (from the past 48 hour(s)). No results found.  Review of Systems  Constitutional: Negative.   HENT: Negative.   Eyes: Negative.   Respiratory: Negative.   Cardiovascular: Negative.   Gastrointestinal: Negative.   Genitourinary: Negative.   Musculoskeletal: Negative.   Skin: Negative.   Neurological: Negative.   Psychiatric/Behavioral: Negative.     There were no vitals taken for this  visit. Physical Exam  Constitutional: She is oriented to person, place, and time. She appears well-developed and well-nourished.  HENT:  Head: Normocephalic and atraumatic.  Eyes: EOM are normal. Pupils are equal, round, and reactive to light.  Cardiovascular: Normal rate.  Respiratory: Effort normal. No respiratory distress.  GI: Soft. She exhibits no distension.  Neurological: She is alert and oriented to person, place, and time.  Skin: Skin is warm.  Psychiatric: She has a normal mood and affect. Her behavior is normal. Judgment and thought content normal.     Assessment/Plan Right breast reduction revision and left breast lipofilling for symmetry.  [place], DO 07/06/2017, 8:09 AM

## 2017-07-07 ENCOUNTER — Ambulatory Visit (HOSPITAL_BASED_OUTPATIENT_CLINIC_OR_DEPARTMENT_OTHER): Payer: 59 | Admitting: Certified Registered"

## 2017-07-07 ENCOUNTER — Encounter (HOSPITAL_BASED_OUTPATIENT_CLINIC_OR_DEPARTMENT_OTHER): Payer: Self-pay | Admitting: Anesthesiology

## 2017-07-07 ENCOUNTER — Ambulatory Visit (HOSPITAL_BASED_OUTPATIENT_CLINIC_OR_DEPARTMENT_OTHER)
Admission: RE | Admit: 2017-07-07 | Discharge: 2017-07-07 | Disposition: A | Discharge status: Home / Self Care | Payer: 59 | Source: Ambulatory Visit | Attending: Plastic Surgery | Admitting: Plastic Surgery

## 2017-07-07 ENCOUNTER — Encounter (HOSPITAL_BASED_OUTPATIENT_CLINIC_OR_DEPARTMENT_OTHER)
Admission: RE | Disposition: A | Discharge status: Home / Self Care | Payer: Self-pay | Source: Ambulatory Visit | Attending: Plastic Surgery

## 2017-07-07 DIAGNOSIS — Y831 Surgical operation with implant of artificial internal device as the cause of abnormal reaction of the patient, or of later complication, without mention of misadventure at the time of the procedure: Secondary | ICD-10-CM | POA: Diagnosis not present

## 2017-07-07 DIAGNOSIS — Z87891 Personal history of nicotine dependence: Secondary | ICD-10-CM | POA: Insufficient documentation

## 2017-07-07 DIAGNOSIS — Z853 Personal history of malignant neoplasm of breast: Secondary | ICD-10-CM | POA: Diagnosis not present

## 2017-07-07 DIAGNOSIS — F329 Major depressive disorder, single episode, unspecified: Secondary | ICD-10-CM | POA: Insufficient documentation

## 2017-07-07 DIAGNOSIS — F419 Anxiety disorder, unspecified: Secondary | ICD-10-CM | POA: Diagnosis not present

## 2017-07-07 DIAGNOSIS — Z9884 Bariatric surgery status: Secondary | ICD-10-CM | POA: Insufficient documentation

## 2017-07-07 DIAGNOSIS — G4730 Sleep apnea, unspecified: Secondary | ICD-10-CM | POA: Diagnosis not present

## 2017-07-07 DIAGNOSIS — N651 Disproportion of reconstructed breast: Secondary | ICD-10-CM

## 2017-07-07 DIAGNOSIS — Z79891 Long term (current) use of opiate analgesic: Secondary | ICD-10-CM | POA: Diagnosis not present

## 2017-07-07 DIAGNOSIS — Z885 Allergy status to narcotic agent status: Secondary | ICD-10-CM | POA: Diagnosis not present

## 2017-07-07 DIAGNOSIS — Z79899 Other long term (current) drug therapy: Secondary | ICD-10-CM | POA: Insufficient documentation

## 2017-07-07 DIAGNOSIS — T8544XA Capsular contracture of breast implant, initial encounter: Secondary | ICD-10-CM | POA: Diagnosis not present

## 2017-07-07 DIAGNOSIS — I10 Essential (primary) hypertension: Secondary | ICD-10-CM | POA: Diagnosis not present

## 2017-07-07 DIAGNOSIS — Z91041 Radiographic dye allergy status: Secondary | ICD-10-CM | POA: Diagnosis not present

## 2017-07-07 DIAGNOSIS — Z9012 Acquired absence of left breast and nipple: Secondary | ICD-10-CM | POA: Insufficient documentation

## 2017-07-07 HISTORY — PX: LIPOSUCTION WITH LIPOFILLING: SHX6436

## 2017-07-07 HISTORY — PX: SCAR REVISION: SHX5285

## 2017-07-07 HISTORY — PX: BREAST RECONSTRUCTION: SHX9

## 2017-07-07 SURGERY — RECONSTRUCTION, BREAST
Anesthesia: General | Site: Breast | Laterality: Left

## 2017-07-07 MED ORDER — ACETAMINOPHEN 325 MG PO TABS: 650.0000 mg | ORAL_TABLET | ORAL | Status: DC | PRN

## 2017-07-07 MED ORDER — BUPIVACAINE HCL (PF) 0.5 % IJ SOLN
INTRAMUSCULAR | Status: AC
  Filled 2017-07-07: qty 30

## 2017-07-07 MED ORDER — LIDOCAINE HCL 1 % IJ SOLN
INTRAMUSCULAR | Status: DC | PRN
  Administered 2017-07-07: 50 mL

## 2017-07-07 MED ORDER — ONDANSETRON HCL 4 MG/2ML IJ SOLN
INTRAMUSCULAR | Status: AC
  Filled 2017-07-07: qty 2

## 2017-07-07 MED ORDER — PROPOFOL 10 MG/ML IV BOLUS
INTRAVENOUS | Status: AC
  Filled 2017-07-07: qty 20

## 2017-07-07 MED ORDER — BACITRACIN ZINC 500 UNIT/GM EX OINT
TOPICAL_OINTMENT | CUTANEOUS | Status: AC
  Filled 2017-07-07: qty 0.9

## 2017-07-07 MED ORDER — SODIUM CHLORIDE 0.9% FLUSH: 3.0000 mL | INTRAVENOUS | Status: DC | PRN

## 2017-07-07 MED ORDER — MIDAZOLAM HCL 2 MG/2ML IJ SOLN
INTRAMUSCULAR | Status: AC
  Filled 2017-07-07: qty 2

## 2017-07-07 MED ORDER — EPINEPHRINE 30 MG/30ML IJ SOLN
INTRAMUSCULAR | Status: AC
  Filled 2017-07-07: qty 1

## 2017-07-07 MED ORDER — CEFAZOLIN SODIUM-DEXTROSE 2-4 GM/100ML-% IV SOLN
2.0000 g | INTRAVENOUS | Status: AC
  Administered 2017-07-07: 2 g via INTRAVENOUS

## 2017-07-07 MED ORDER — DEXAMETHASONE SODIUM PHOSPHATE 10 MG/ML IJ SOLN
INTRAMUSCULAR | Status: AC
  Filled 2017-07-07: qty 1

## 2017-07-07 MED ORDER — LACTATED RINGERS IV SOLN: INTRAVENOUS | Status: DC

## 2017-07-07 MED ORDER — SCOPOLAMINE 1 MG/3DAYS TD PT72: 1.0000 | MEDICATED_PATCH | Freq: Once | TRANSDERMAL | Status: DC | PRN

## 2017-07-07 MED ORDER — ROCURONIUM BROMIDE 10 MG/ML (PF) SYRINGE
PREFILLED_SYRINGE | INTRAVENOUS | Status: AC
  Filled 2017-07-07: qty 5

## 2017-07-07 MED ORDER — SODIUM CHLORIDE 0.9% FLUSH: 3.0000 mL | Freq: Two times a day (BID) | INTRAVENOUS | Status: DC

## 2017-07-07 MED ORDER — EPHEDRINE 5 MG/ML INJ
INTRAVENOUS | Status: AC
  Filled 2017-07-07: qty 10

## 2017-07-07 MED ORDER — LIDOCAINE HCL (PF) 1 % IJ SOLN
INTRAMUSCULAR | Status: AC
  Filled 2017-07-07: qty 60

## 2017-07-07 MED ORDER — PROMETHAZINE HCL 25 MG/ML IJ SOLN: 6.2500 mg | INTRAMUSCULAR | Status: DC | PRN

## 2017-07-07 MED ORDER — HYDROMORPHONE HCL 1 MG/ML IJ SOLN: 0.2500 mg | INTRAMUSCULAR | Status: DC | PRN

## 2017-07-07 MED ORDER — BUPIVACAINE HCL (PF) 0.25 % IJ SOLN
INTRAMUSCULAR | Status: AC
  Filled 2017-07-07: qty 30

## 2017-07-07 MED ORDER — SUGAMMADEX SODIUM 200 MG/2ML IV SOLN
INTRAVENOUS | Status: AC
  Filled 2017-07-07: qty 2

## 2017-07-07 MED ORDER — LIDOCAINE-EPINEPHRINE 2 %-1:100000 IJ SOLN
INTRAMUSCULAR | Status: AC
Start: 2017-07-07 — End: ?
  Filled 2017-07-07: qty 1

## 2017-07-07 MED ORDER — SODIUM CHLORIDE 0.9 % IV SOLN: 250.0000 mL | INTRAVENOUS | Status: DC | PRN

## 2017-07-07 MED ORDER — LIDOCAINE 2% (20 MG/ML) 5 ML SYRINGE
INTRAMUSCULAR | Status: DC | PRN
  Administered 2017-07-07: 60 mg via INTRAVENOUS

## 2017-07-07 MED ORDER — KETOROLAC TROMETHAMINE 30 MG/ML IJ SOLN
INTRAMUSCULAR | Status: DC | PRN
  Administered 2017-07-07: 30 mg via INTRAVENOUS

## 2017-07-07 MED ORDER — PROPOFOL 10 MG/ML IV BOLUS
INTRAVENOUS | Status: DC | PRN
  Administered 2017-07-07: 30 mg via INTRAVENOUS
  Administered 2017-07-07: 140 mg via INTRAVENOUS

## 2017-07-07 MED ORDER — MEPERIDINE HCL 25 MG/ML IJ SOLN: 6.2500 mg | INTRAMUSCULAR | Status: DC | PRN

## 2017-07-07 MED ORDER — EPINEPHRINE PF 1 MG/ML IJ SOLN
INTRAMUSCULAR | Status: DC | PRN
  Administered 2017-07-07: 1 mg

## 2017-07-07 MED ORDER — LACTATED RINGERS IV SOLN
INTRAVENOUS | Status: AC | PRN
  Administered 2017-07-07: 500 mL via INTRAVENOUS

## 2017-07-07 MED ORDER — LACTATED RINGERS IV SOLN
INTRAVENOUS | Status: DC
  Administered 2017-07-07 (×2): via INTRAVENOUS

## 2017-07-07 MED ORDER — ROCURONIUM BROMIDE 100 MG/10ML IV SOLN
INTRAVENOUS | Status: DC | PRN
  Administered 2017-07-07: 50 mg via INTRAVENOUS

## 2017-07-07 MED ORDER — FENTANYL CITRATE (PF) 100 MCG/2ML IJ SOLN
INTRAMUSCULAR | Status: AC
  Filled 2017-07-07: qty 2

## 2017-07-07 MED ORDER — DEXAMETHASONE SODIUM PHOSPHATE 10 MG/ML IJ SOLN
INTRAMUSCULAR | Status: DC | PRN
  Administered 2017-07-07: 10 mg via INTRAVENOUS

## 2017-07-07 MED ORDER — CEFAZOLIN SODIUM-DEXTROSE 2-4 GM/100ML-% IV SOLN
INTRAVENOUS | Status: AC
  Filled 2017-07-07: qty 100

## 2017-07-07 MED ORDER — BUPIVACAINE-EPINEPHRINE (PF) 0.5% -1:200000 IJ SOLN
INTRAMUSCULAR | Status: AC
  Filled 2017-07-07: qty 30

## 2017-07-07 MED ORDER — ACETAMINOPHEN 650 MG RE SUPP: 650.0000 mg | RECTAL | Status: DC | PRN

## 2017-07-07 MED ORDER — FENTANYL CITRATE (PF) 100 MCG/2ML IJ SOLN
50.0000 ug | INTRAMUSCULAR | Status: AC | PRN
  Administered 2017-07-07 (×3): 50 ug via INTRAVENOUS

## 2017-07-07 MED ORDER — LIDOCAINE 2% (20 MG/ML) 5 ML SYRINGE
INTRAMUSCULAR | Status: AC
  Filled 2017-07-07: qty 5

## 2017-07-07 MED ORDER — MIDAZOLAM HCL 2 MG/2ML IJ SOLN
1.0000 mg | INTRAMUSCULAR | Status: DC | PRN
  Administered 2017-07-07: 2 mg via INTRAVENOUS

## 2017-07-07 MED ORDER — SUGAMMADEX SODIUM 200 MG/2ML IV SOLN
INTRAVENOUS | Status: DC | PRN
  Administered 2017-07-07: 200 mg via INTRAVENOUS

## 2017-07-07 MED ORDER — ONDANSETRON HCL 4 MG/2ML IJ SOLN
INTRAMUSCULAR | Status: DC | PRN
  Administered 2017-07-07: 4 mg via INTRAVENOUS

## 2017-07-07 MED ORDER — HYDROCODONE-ACETAMINOPHEN 7.5-325 MG PO TABS: 1.0000 | ORAL_TABLET | Freq: Once | ORAL | Status: DC | PRN

## 2017-07-07 MED ORDER — KETOROLAC TROMETHAMINE 30 MG/ML IJ SOLN
INTRAMUSCULAR | Status: AC
  Filled 2017-07-07: qty 1

## 2017-07-07 MED ORDER — EPHEDRINE SULFATE-NACL 50-0.9 MG/10ML-% IV SOSY
PREFILLED_SYRINGE | INTRAVENOUS | Status: DC | PRN
  Administered 2017-07-07: 10 mg via INTRAVENOUS
  Administered 2017-07-07 (×2): 5 mg via INTRAVENOUS

## 2017-07-07 SURGICAL SUPPLY — 112 items
BAG DECANTER FOR FLEXI CONT (MISCELLANEOUS) ×4 IMPLANT
BINDER ABDOMINAL  9 SM 30-45 (SOFTGOODS) ×2
BINDER ABDOMINAL 10 UNV 27-48 (MISCELLANEOUS) IMPLANT
BINDER ABDOMINAL 12 SM 30-45 (SOFTGOODS) IMPLANT
BINDER ABDOMINAL 9 SM 30-45 (SOFTGOODS) ×2 IMPLANT
BINDER BREAST LRG (GAUZE/BANDAGES/DRESSINGS) ×4 IMPLANT
BINDER BREAST MEDIUM (GAUZE/BANDAGES/DRESSINGS) IMPLANT
BINDER BREAST XLRG (GAUZE/BANDAGES/DRESSINGS) IMPLANT
BINDER BREAST XXLRG (GAUZE/BANDAGES/DRESSINGS) IMPLANT
BIOPATCH RED 1 DISK 7.0 (GAUZE/BANDAGES/DRESSINGS) IMPLANT
BIOPATCH RED 1IN DISK 7.0MM (GAUZE/BANDAGES/DRESSINGS)
BLADE CLIPPER SURG (BLADE) IMPLANT
BLADE HEX COATED 2.75 (ELECTRODE) ×4 IMPLANT
BLADE SURG 15 STRL LF DISP TIS (BLADE) ×2 IMPLANT
BLADE SURG 15 STRL SS (BLADE) ×2
BNDG CONFORM 2 STRL LF (GAUZE/BANDAGES/DRESSINGS) IMPLANT
BNDG ELASTIC 2X5.8 VLCR STR LF (GAUZE/BANDAGES/DRESSINGS) IMPLANT
BNDG GAUZE ELAST 4 BULKY (GAUZE/BANDAGES/DRESSINGS) ×8 IMPLANT
CANISTER SUCT 1200ML W/VALVE (MISCELLANEOUS) ×4 IMPLANT
CHLORAPREP W/TINT 26ML (MISCELLANEOUS) ×4 IMPLANT
CLOSURE STERI-STRIP 1/2X4 (GAUZE/BANDAGES/DRESSINGS)
CLOSURE WOUND 1/2 X4 (GAUZE/BANDAGES/DRESSINGS)
CLSR STERI-STRIP ANTIMIC 1/2X4 (GAUZE/BANDAGES/DRESSINGS) IMPLANT
CORD BIPOLAR FORCEPS 12FT (ELECTRODE) IMPLANT
COVER BACK TABLE 60X90IN (DRAPES) ×4 IMPLANT
COVER MAYO STAND STRL (DRAPES) ×8 IMPLANT
DECANTER SPIKE VIAL GLASS SM (MISCELLANEOUS) IMPLANT
DERMABOND ADVANCED (GAUZE/BANDAGES/DRESSINGS) ×2
DERMABOND ADVANCED .7 DNX12 (GAUZE/BANDAGES/DRESSINGS) ×2 IMPLANT
DRAIN CHANNEL 19F RND (DRAIN) IMPLANT
DRAPE LAPAROSCOPIC ABDOMINAL (DRAPES) ×4 IMPLANT
DRAPE LAPAROTOMY 100X72 PEDS (DRAPES) IMPLANT
DRAPE U-SHAPE 76X120 STRL (DRAPES) IMPLANT
DRSG PAD ABDOMINAL 8X10 ST (GAUZE/BANDAGES/DRESSINGS) ×12 IMPLANT
ELECT BLADE 4.0 EZ CLEAN MEGAD (MISCELLANEOUS) ×4
ELECT BLADE 6.5 .24CM SHAFT (ELECTRODE) IMPLANT
ELECT COATED BLADE 2.86 ST (ELECTRODE) IMPLANT
ELECT NEEDLE BLADE 2-5/6 (NEEDLE) ×4 IMPLANT
ELECT REM PT RETURN 9FT ADLT (ELECTROSURGICAL) ×4
ELECT REM PT RETURN 9FT PED (ELECTROSURGICAL)
ELECTRODE BLDE 4.0 EZ CLN MEGD (MISCELLANEOUS) ×2 IMPLANT
ELECTRODE REM PT RETRN 9FT PED (ELECTROSURGICAL) IMPLANT
ELECTRODE REM PT RTRN 9FT ADLT (ELECTROSURGICAL) ×2 IMPLANT
EVACUATOR SILICONE 100CC (DRAIN) IMPLANT
EXTRACTOR CANIST REVOLVE STRL (CANNISTER) ×4 IMPLANT
GAUZE SPONGE 4X4 12PLY STRL LF (GAUZE/BANDAGES/DRESSINGS) IMPLANT
GAUZE XEROFORM 1X8 LF (GAUZE/BANDAGES/DRESSINGS) IMPLANT
GLOVE BIO SURGEON STRL SZ 6.5 (GLOVE) ×12 IMPLANT
GLOVE BIO SURGEONS STRL SZ 6.5 (GLOVE) ×4
GOWN STRL REUS W/ TWL LRG LVL3 (GOWN DISPOSABLE) ×6 IMPLANT
GOWN STRL REUS W/TWL LRG LVL3 (GOWN DISPOSABLE) ×6
IV LACTATED RINGERS 1000ML (IV SOLUTION) ×12 IMPLANT
IV NS 500ML (IV SOLUTION) ×2
IV NS 500ML BAXH (IV SOLUTION) ×2 IMPLANT
KIT FILL SYSTEM UNIVERSAL (SET/KITS/TRAYS/PACK) IMPLANT
LINER CANISTER 1000CC FLEX (MISCELLANEOUS) ×12 IMPLANT
NDL SAFETY ECLIPSE 18X1.5 (NEEDLE) ×2 IMPLANT
NEEDLE HYPO 18GX1.5 SHARP (NEEDLE) ×2
NEEDLE HYPO 25X1 1.5 SAFETY (NEEDLE) IMPLANT
NEEDLE HYPO 30GX1 BEV (NEEDLE) IMPLANT
NEEDLE PRECISIONGLIDE 27X1.5 (NEEDLE) IMPLANT
NS IRRIG 1000ML POUR BTL (IV SOLUTION) IMPLANT
PACK BASIN DAY SURGERY FS (CUSTOM PROCEDURE TRAY) ×4 IMPLANT
PAD ALCOHOL SWAB (MISCELLANEOUS) ×4 IMPLANT
PENCIL BUTTON HOLSTER BLD 10FT (ELECTRODE) ×4 IMPLANT
PIN SAFETY STERILE (MISCELLANEOUS) IMPLANT
SHEET MEDIUM DRAPE 40X70 STRL (DRAPES) IMPLANT
SLEEVE SCD COMPRESS KNEE MED (MISCELLANEOUS) ×4 IMPLANT
SPONGE GAUZE 2X2 8PLY STER LF (GAUZE/BANDAGES/DRESSINGS)
SPONGE GAUZE 2X2 8PLY STRL LF (GAUZE/BANDAGES/DRESSINGS) IMPLANT
SPONGE LAP 18X18 X RAY DECT (DISPOSABLE) ×8 IMPLANT
STRIP CLOSURE SKIN 1/2X4 (GAUZE/BANDAGES/DRESSINGS) IMPLANT
STRIP SUTURE WOUND CLOSURE 1/2 (SUTURE) IMPLANT
SUCTION FRAZIER HANDLE 10FR (MISCELLANEOUS)
SUCTION TUBE FRAZIER 10FR DISP (MISCELLANEOUS) IMPLANT
SUT CHROMIC 4 0 P 3 18 (SUTURE) IMPLANT
SUT ETHILON 4 0 PS 2 18 (SUTURE) IMPLANT
SUT ETHILON 5 0 P 3 18 (SUTURE)
SUT MNCRL 6-0 UNDY P1 1X18 (SUTURE) IMPLANT
SUT MNCRL AB 4-0 PS2 18 (SUTURE) IMPLANT
SUT MON AB 3-0 SH 27 (SUTURE) ×2
SUT MON AB 3-0 SH27 (SUTURE) ×2 IMPLANT
SUT MON AB 5-0 P3 18 (SUTURE) IMPLANT
SUT MON AB 5-0 PS2 18 (SUTURE) ×8 IMPLANT
SUT MONOCRYL 6-0 P1 1X18 (SUTURE)
SUT NYLON ETHILON 5-0 P-3 1X18 (SUTURE) IMPLANT
SUT PDS 3-0 CT2 (SUTURE)
SUT PDS AB 2-0 CT2 27 (SUTURE) IMPLANT
SUT PDS II 3-0 CT2 27 ABS (SUTURE) IMPLANT
SUT PLAIN 5 0 P 3 18 (SUTURE) IMPLANT
SUT SILK 3 0 PS 1 (SUTURE) IMPLANT
SUT VIC AB 3-0 SH 27 (SUTURE)
SUT VIC AB 3-0 SH 27X BRD (SUTURE) IMPLANT
SUT VIC AB 5-0 P-3 18X BRD (SUTURE) IMPLANT
SUT VIC AB 5-0 P3 18 (SUTURE)
SUT VICRYL 4-0 PS2 18IN ABS (SUTURE) ×4 IMPLANT
SUT VICRYL 6 0 P 1 18 (SUTURE) IMPLANT
SYR 10ML LL (SYRINGE) ×32 IMPLANT
SYR 3ML 18GX1 1/2 (SYRINGE) IMPLANT
SYR 50ML LL SCALE MARK (SYRINGE) ×8 IMPLANT
SYR BULB 3OZ (MISCELLANEOUS) IMPLANT
SYR BULB IRRIGATION 50ML (SYRINGE) ×4 IMPLANT
SYR CONTROL 10ML LL (SYRINGE) ×4 IMPLANT
SYR TOOMEY 50ML (SYRINGE) ×8 IMPLANT
TOWEL OR 17X24 6PK STRL BLUE (TOWEL DISPOSABLE) ×8 IMPLANT
TRAY DSU PREP LF (CUSTOM PROCEDURE TRAY) IMPLANT
TUBE CONNECTING 20'X1/4 (TUBING) ×1
TUBE CONNECTING 20X1/4 (TUBING) ×3 IMPLANT
TUBING INFILTRATION IT-10001 (TUBING) IMPLANT
TUBING SET GRADUATE ASPIR 12FT (MISCELLANEOUS) ×4 IMPLANT
UNDERPAD 30X30 (UNDERPADS AND DIAPERS) ×8 IMPLANT
YANKAUER SUCT BULB TIP NO VENT (SUCTIONS) ×4 IMPLANT

## 2017-07-07 NOTE — Anesthesia Preprocedure Evaluation (Addendum)
Anesthesia Evaluation  Patient identified by MRN, date of birth, ID band Patient awake    Reviewed: Allergy & Precautions, NPO status , Patient's Chart, lab work & pertinent test results  Airway Mallampati: II  TM Distance: >3 FB Neck ROM: Full Positive for:  Tracheal deviation   Dental  (+) Teeth Intact, Dental Advisory Given   Pulmonary sleep apnea , former smoker,    breath sounds clear to auscultation       Cardiovascular hypertension,  Rhythm:Regular Rate:Normal     Neuro/Psych PSYCHIATRIC DISORDERS Anxiety Depression    GI/Hepatic Neg liver ROS, GERD  ,  Endo/Other  negative endocrine ROS  Renal/GU negative Renal ROS  negative genitourinary   Musculoskeletal negative musculoskeletal ROS (+)   Abdominal Normal abdominal exam  (+)   Peds  Hematology negative hematology ROS (+)   Anesthesia Other Findings   Reproductive/Obstetrics                            Anesthesia Physical Anesthesia Plan  ASA: II  Anesthesia Plan: General   Post-op Pain Management:    Induction: Intravenous  PONV Risk Score and Plan: 4 or greater and Ondansetron, Dexamethasone, Midazolam and Scopolamine patch - Pre-op  Airway Management Planned: Oral ETT  Additional Equipment: None  Intra-op Plan:   Post-operative Plan: Extubation in OR  Informed Consent: I have reviewed the patients History and Physical, chart, labs and discussed the procedure including the risks, benefits and alternatives for the proposed anesthesia with the patient or authorized representative who has indicated his/her understanding and acceptance.   Dental advisory given  Plan Discussed with: CRNA  Anesthesia Plan Comments:         Anesthesia Quick Evaluation

## 2017-07-07 NOTE — Anesthesia Postprocedure Evaluation (Signed)
Anesthesia Post Note  Patient: Nancy Perry  Procedure(s) Performed: REVISION OF LEFT and RIGHT BREAST RECONSTRUCTION WITH LIPOFILLING TO LEFT BREAST POSSIBLE LEFT MEDIAL BREAST SCAR RELEASE (Left Breast) LIPOSUCTION WITH LIPOFILLING (Left Abdomen) POSSIBLE MEDIAL SCAR RELEASE (Left Breast)     Patient location during evaluation: PACU Anesthesia Type: General Level of consciousness: awake and alert Pain management: pain level controlled Vital Signs Assessment: post-procedure vital signs reviewed and stable Respiratory status: spontaneous breathing, nonlabored ventilation, respiratory function stable and patient connected to nasal cannula oxygen Cardiovascular status: blood pressure returned to baseline and stable Postop Assessment: no apparent nausea or vomiting Anesthetic complications: no    Last Vitals:  Vitals:   07/07/17 1045 07/07/17 1108  BP: 121/70 127/71  Pulse: 69 77  Resp: 18 16  Temp:  36.6 C  SpO2: 96% 97%    Last Pain:  Vitals:   07/07/17 1108  TempSrc:   PainSc: 0-No pain                 Effie Berkshire

## 2017-07-07 NOTE — Anesthesia Procedure Notes (Signed)
Procedure Name: Intubation Date/Time: 07/07/2017 8:05 AM Performed by: Gwyndolyn Saxon, CRNA Pre-anesthesia Checklist: Patient identified, Emergency Drugs available, Suction available, Timeout performed and Patient being monitored Patient Re-evaluated:Patient Re-evaluated prior to induction Oxygen Delivery Method: Circle system utilized Preoxygenation: Pre-oxygenation with 100% oxygen Induction Type: IV induction Ventilation: Mask ventilation without difficulty Laryngoscope Size: Miller and 2 Grade View: Grade I Tube type: Oral Tube size: 7.0 mm Number of attempts: 1 Placement Confirmation: ETT inserted through vocal cords under direct vision,  positive ETCO2,  CO2 detector and breath sounds checked- equal and bilateral Secured at: 21 cm Tube secured with: Tape Dental Injury: Teeth and Oropharynx as per pre-operative assessment

## 2017-07-07 NOTE — Discharge Instructions (Signed)
No ibuprofen until after 330 pm today   Post Anesthesia Home Care Instructions  Activity: Get plenty of rest for the remainder of the day. A responsible individual must stay with you for 24 hours following the procedure.  For the next 24 hours, DO NOT: -Drive a car -Paediatric nurse -Drink alcoholic beverages -Take any medication unless instructed by your physician -Make any legal decisions or sign important papers.  Meals: Start with liquid foods such as gelatin or soup. Progress to regular foods as tolerated. Avoid greasy, spicy, heavy foods. If nausea and/or vomiting occur, drink only clear liquids until the nausea and/or vomiting subsides. Call your physician if vomiting continues.  Special Instructions/Symptoms: Your throat may feel dry or sore from the anesthesia or the breathing tube placed in your throat during surgery. If this causes discomfort, gargle with warm salt water. The discomfort should disappear within 24 hours.  If you had a scopolamine patch placed behind your ear for the management of post- operative nausea and/or vomiting:  1. The medication in the patch is effective for 72 hours, after which it should be removed.  Wrap patch in a tissue and discard in the trash. Wash hands thoroughly with soap and water. 2. You may remove the patch earlier than 72 hours if you experience unpleasant side effects which may include dry mouth, dizziness or visual disturbances. 3. Avoid touching the patch. Wash your hands with soap and water after contact with the patch.   No heavy lifting. Continue the binder or sports bra. spanx for the abdominal area. No driving while on pain meds. Be sure to drink lots of water and walk around the house.

## 2017-07-07 NOTE — Transfer of Care (Signed)
Immediate Anesthesia Transfer of Care Note  Patient: Ethelyn A. Weissmann  Procedure(s) Performed: REVISION OF LEFT and RIGHT BREAST RECONSTRUCTION WITH LIPOFILLING TO LEFT BREAST POSSIBLE LEFT MEDIAL BREAST SCAR RELEASE (Left Breast) LIPOSUCTION WITH LIPOFILLING (Left Abdomen) POSSIBLE MEDIAL SCAR RELEASE (Left Breast)  Patient Location: PACU  Anesthesia Type:General  Level of Consciousness: awake, alert  and oriented  Airway & Oxygen Therapy: Patient Spontanous Breathing and Patient connected to face mask oxygen  Post-op Assessment: Report given to RN and Post -op Vital signs reviewed and stable  Post vital signs: Reviewed and stable  Last Vitals:  Vitals:   07/07/17 0700 07/07/17 0953  BP: 114/64 (P) 126/62  Pulse: (!) 57 86  Temp: (!) 36.4 C (!) (P) 36.4 C  SpO2: 100% (P) 100%    Last Pain:  Vitals:   07/07/17 0953  TempSrc:   PainSc: (P) 0-No pain         Complications: No apparent anesthesia complications

## 2017-07-07 NOTE — Interval H&P Note (Signed)
History and Physical Interval Note:  07/07/2017 8:07 AM  Nancy Perry  has presented today for surgery, with the diagnosis of status post left breast reconstruction  acquired absence of left breast  The various methods of treatment have been discussed with the patient and family. After consideration of risks, benefits and other options for treatment, the patient has consented to  Procedure(s): REVISION OF LEFT and RIGHT BREAST RECONSTRUCTION WITH LIPOFILLING TO LEFT BREAST POSSIBLE LEFT MEDIAL BREAST SCAR RELEASE (Left) LIPOSUCTION WITH LIPOFILLING (Left) POSSIBLE MEDIAL SCAR RELEASE (Left) as a surgical intervention .  The patient's history has been reviewed, patient examined, no change in status, stable for surgery.  I have reviewed the patient's chart and labs.  Questions were answered to the patient's satisfaction.     Loel Lofty Dillingham

## 2017-07-07 NOTE — Op Note (Signed)
Op report Unilateral Breast Exchange   DATE OF OPERATION:  07/07/2017  LOCATION: [place]  SURGICAL DIVISION: Plastic Surgery  PREOPERATIVE DIAGNOSES:  1. History of left breast cancer.  2. Acquired absence of left breast.  3. Breast asymmetry due to reconstruction for breast cancer. 4. Capsule contracture of the left breast.  POSTOPERATIVE DIAGNOSES:  1. History of left breast cancer.  2. Acquired absence of left breast.  3. Breast asymmetry due to reconstruction for breast cancer. 4. Capsule contracture of the left breast.  PROCEDURE:  1. Excision of right breast tissue for better symmetry 3 x 7 cm 2. lipofilling to left breast for better symmetry. 3. Release of capsule contracture of the left medial breast.  SURGEON: Jeronimo Hellberg [place]anger Alok Minshall, DO  ASSISTANT: Shawn Rayburn, PA  ANESTHESIA:  General.   COMPLICATIONS: None.   INDICATIONS FOR PROCEDURE:  The patient, Nancy Perry, is a 57 y.o. female born on [birth year], is here for treatment for further treatment after a mastectomy and placement of a tissue expander followed by a right breast reduction and left implant placement.  She had resulting asymmetry that was making it difficult to fit into a bra.  MRN: [id number]  CONSENT:  Informed consent was obtained directly from the patient. Risks, benefits and alternatives were fully discussed. Specific risks including but not limited to bleeding, infection, hematoma, seroma, scarring, pain, implant infection, implant extrusion, capsular contracture, asymmetry, wound healing problems, and need for further surgery were all discussed. The patient did have an ample opportunity to have her questions answered to her satisfaction.   DESCRIPTION OF PROCEDURE:  The patient was taken to the operating room. SCDs were placed and IV antibiotics were given. The patient's chest and abdomen were prepped and draped in a sterile fashion. A time out was performed and  the implants to be used were identified.  One Xylocaine with epinephrine was used to infiltrate the area.   Right: The previous inframammary scar was excised in an ellipse with the #10 blade.   The bovie was used to obtain hemostasis.  The excess fat at the inferior poll was excised and sent to path.  The 3-0 and 4-0 Monocryl was used to reapproximate the skin and soft tissue.  There was tumescent placed laterally.  Liposuction was performed laterally to help reduce the bulk of the breast.  The 5-0 Monocryl was then used to close the skin.  The cannister for this was separate then that used for harvesting the fat from the abdomen.  Tumescent was infused through a 1 cm incision in the infraumbilical area using a previous incision.  The 500 cc of tumescent was infused into the fat layer.  The liposuction was then performed to harvest the fat into the Revolve cannister.  It was prepared according to the manufacture guidelines.    Left: The #15 blade was used to make a 1 cm incision in the medical aspect of the left breast previous incision.  The pickled fork shaped cannula was used to perform capsulotomies of the medial aspect and help release the tissue and capsule contracture.  The fat was the infused into the subcutaneous fat space for a total of 120 cc to include the inferior poll and medial superior poll.  The 5-0 Monocryl was used to close the incision.  The derma bond was applied to the right breast.  A breast binder, abdominal binder and ABD was applied.  The patient was allowed to wake from anesthesia and taken to  the recovery room in satisfactory condition.

## 2017-07-08 ENCOUNTER — Encounter (HOSPITAL_BASED_OUTPATIENT_CLINIC_OR_DEPARTMENT_OTHER): Payer: Self-pay | Admitting: Plastic Surgery

## 2017-11-09 DIAGNOSIS — Z170 Estrogen receptor positive status [ER+]: Secondary | ICD-10-CM | POA: Diagnosis not present

## 2017-11-09 DIAGNOSIS — Z9012 Acquired absence of left breast and nipple: Secondary | ICD-10-CM

## 2017-11-09 DIAGNOSIS — D0512 Intraductal carcinoma in situ of left breast: Secondary | ICD-10-CM | POA: Diagnosis not present

## 2017-11-09 DIAGNOSIS — Z79810 Long term (current) use of selective estrogen receptor modulators (SERMs): Secondary | ICD-10-CM
